# Patient Record
Sex: Female | Born: 1950 | Race: White | Hispanic: No | Marital: Single | Smoking: Former smoker
Health system: Southern US, Community
[De-identification: ages and names within clinical notes are randomized; demographics above are authoritative.]

## PROBLEM LIST (undated history)

## (undated) DIAGNOSIS — T7840XA Allergy, unspecified, initial encounter: Secondary | ICD-10-CM

## (undated) DIAGNOSIS — I4891 Unspecified atrial fibrillation: Secondary | ICD-10-CM

## (undated) DIAGNOSIS — E559 Vitamin D deficiency, unspecified: Secondary | ICD-10-CM

## (undated) DIAGNOSIS — R030 Elevated blood-pressure reading, without diagnosis of hypertension: Secondary | ICD-10-CM

## (undated) HISTORY — DX: Elevated blood-pressure reading, without diagnosis of hypertension: R03.0

## (undated) HISTORY — DX: Unspecified atrial fibrillation: I48.91

## (undated) HISTORY — DX: Allergy, unspecified, initial encounter: T78.40XA

---

## 1999-05-18 ENCOUNTER — Encounter: Admission: RE | Admit: 1999-05-18 | Discharge: 1999-05-18 | Payer: Self-pay | Admitting: Internal Medicine

## 1999-05-18 ENCOUNTER — Encounter: Payer: Self-pay | Admitting: Internal Medicine

## 1999-05-25 ENCOUNTER — Encounter: Admission: RE | Admit: 1999-05-25 | Discharge: 1999-05-25 | Payer: Self-pay | Admitting: Internal Medicine

## 1999-05-28 ENCOUNTER — Encounter: Admission: RE | Admit: 1999-05-28 | Discharge: 1999-05-28 | Payer: Self-pay | Admitting: Internal Medicine

## 1999-05-28 ENCOUNTER — Encounter: Payer: Self-pay | Admitting: Internal Medicine

## 2002-11-13 ENCOUNTER — Encounter: Payer: Self-pay | Admitting: Internal Medicine

## 2002-11-13 ENCOUNTER — Encounter: Admission: RE | Admit: 2002-11-13 | Discharge: 2002-11-13 | Payer: Self-pay | Admitting: Internal Medicine

## 2004-06-11 ENCOUNTER — Encounter: Admission: RE | Admit: 2004-06-11 | Discharge: 2004-06-11 | Payer: Self-pay | Admitting: Internal Medicine

## 2010-12-17 ENCOUNTER — Other Ambulatory Visit: Payer: Self-pay | Admitting: Internal Medicine

## 2010-12-17 DIAGNOSIS — Z1231 Encounter for screening mammogram for malignant neoplasm of breast: Secondary | ICD-10-CM

## 2011-02-25 ENCOUNTER — Ambulatory Visit
Admission: RE | Admit: 2011-02-25 | Discharge: 2011-02-25 | Disposition: A | Discharge status: Home / Self Care | Payer: BC Managed Care – PPO | Source: Ambulatory Visit | Attending: Internal Medicine | Admitting: Internal Medicine

## 2011-02-25 DIAGNOSIS — Z1231 Encounter for screening mammogram for malignant neoplasm of breast: Secondary | ICD-10-CM

## 2012-02-21 ENCOUNTER — Other Ambulatory Visit: Payer: Self-pay | Admitting: Internal Medicine

## 2012-02-21 DIAGNOSIS — Z1231 Encounter for screening mammogram for malignant neoplasm of breast: Secondary | ICD-10-CM

## 2012-04-13 ENCOUNTER — Ambulatory Visit
Admission: RE | Admit: 2012-04-13 | Discharge: 2012-04-13 | Disposition: A | Discharge status: Home / Self Care | Payer: BC Managed Care – PPO | Source: Ambulatory Visit | Attending: Internal Medicine | Admitting: Internal Medicine

## 2012-04-13 DIAGNOSIS — Z1231 Encounter for screening mammogram for malignant neoplasm of breast: Secondary | ICD-10-CM

## 2013-03-21 ENCOUNTER — Other Ambulatory Visit: Payer: Self-pay | Admitting: Internal Medicine

## 2013-03-21 DIAGNOSIS — Z1231 Encounter for screening mammogram for malignant neoplasm of breast: Secondary | ICD-10-CM

## 2013-05-24 ENCOUNTER — Other Ambulatory Visit: Payer: Self-pay | Admitting: Internal Medicine

## 2013-05-24 ENCOUNTER — Ambulatory Visit
Admission: RE | Admit: 2013-05-24 | Discharge: 2013-05-24 | Disposition: A | Discharge status: Home / Self Care | Payer: BC Managed Care – PPO | Source: Ambulatory Visit | Attending: Internal Medicine | Admitting: Internal Medicine

## 2013-05-24 DIAGNOSIS — R05 Cough: Secondary | ICD-10-CM

## 2013-05-24 DIAGNOSIS — Z1231 Encounter for screening mammogram for malignant neoplasm of breast: Secondary | ICD-10-CM

## 2014-03-26 IMAGING — CR DG CHEST 2V
2 series · 2 of 2 positions shown · non-contrast
Comparison: None.

CLINICAL DATA: Cough, shortness of breath.

EXAM:
CHEST  2 VIEW

[w chest pa]
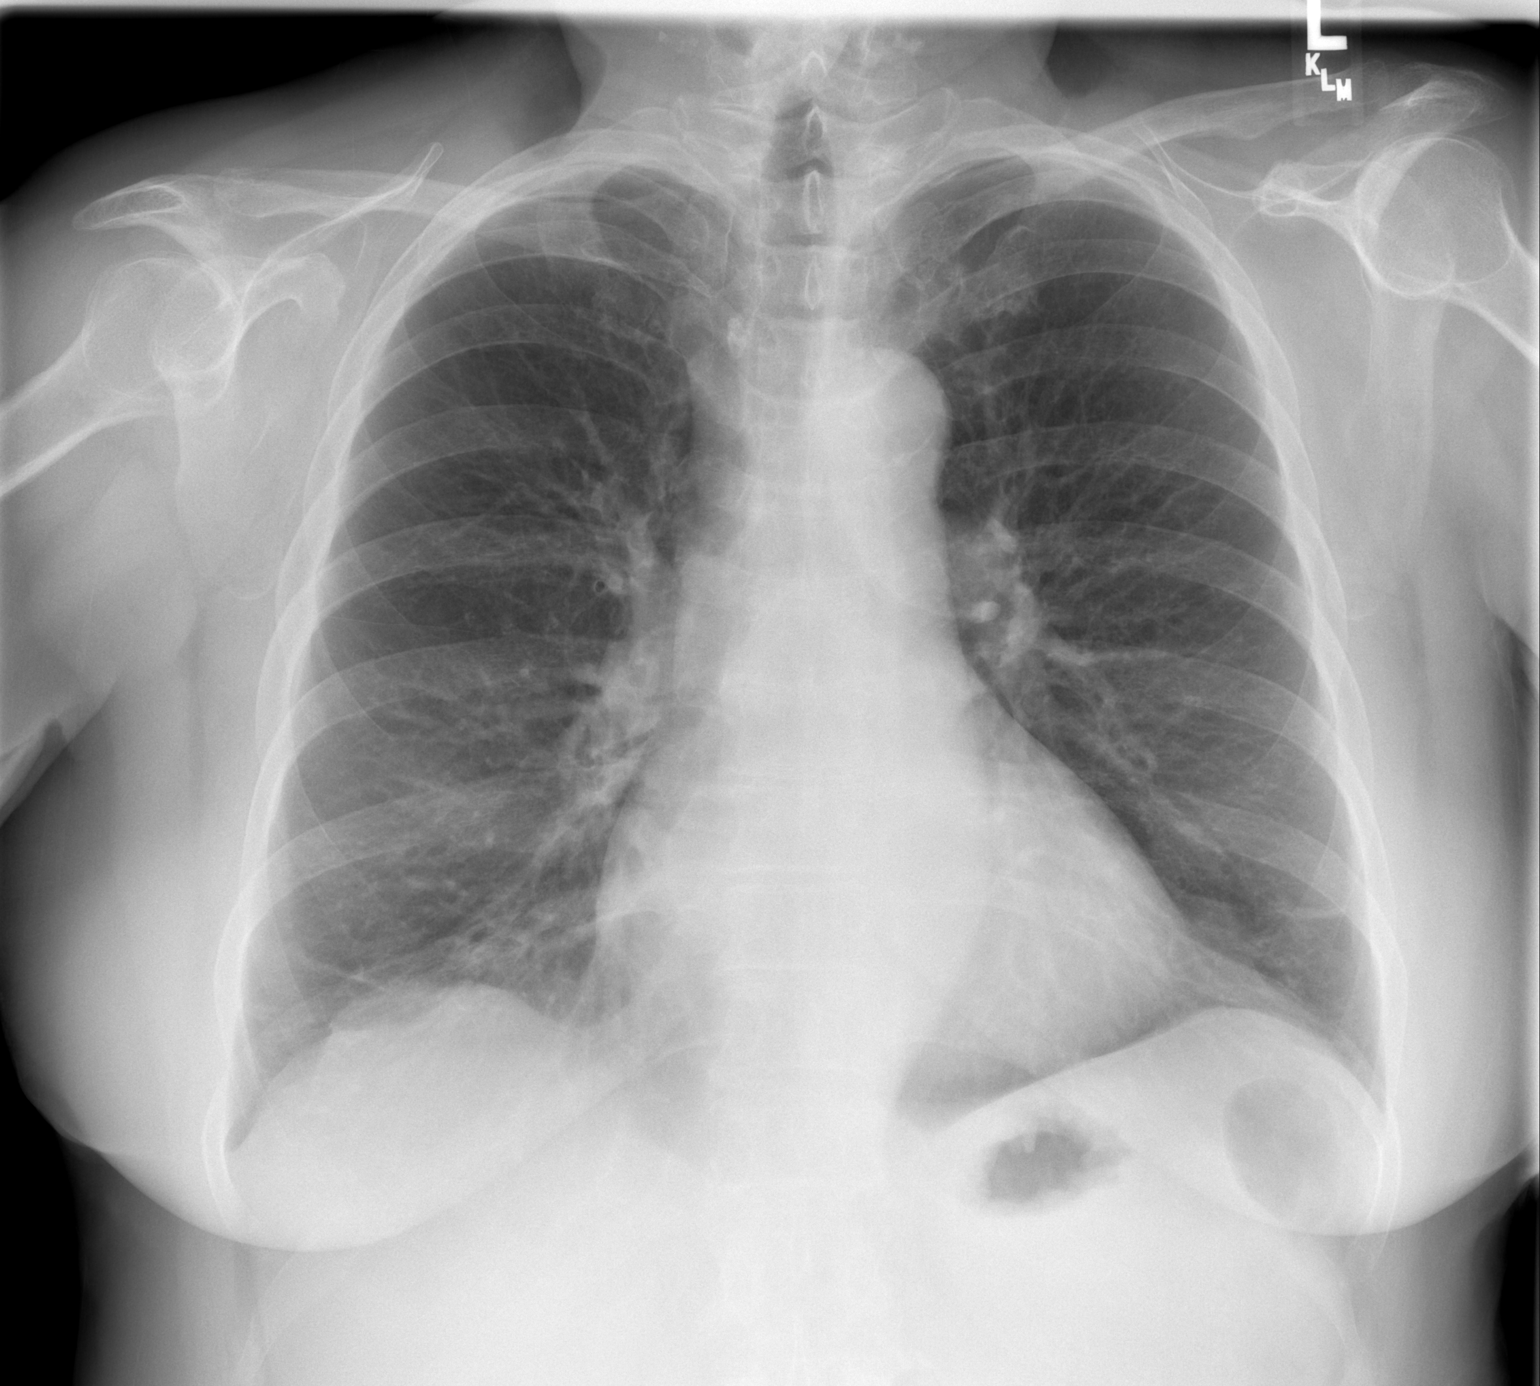

[w chest lat]
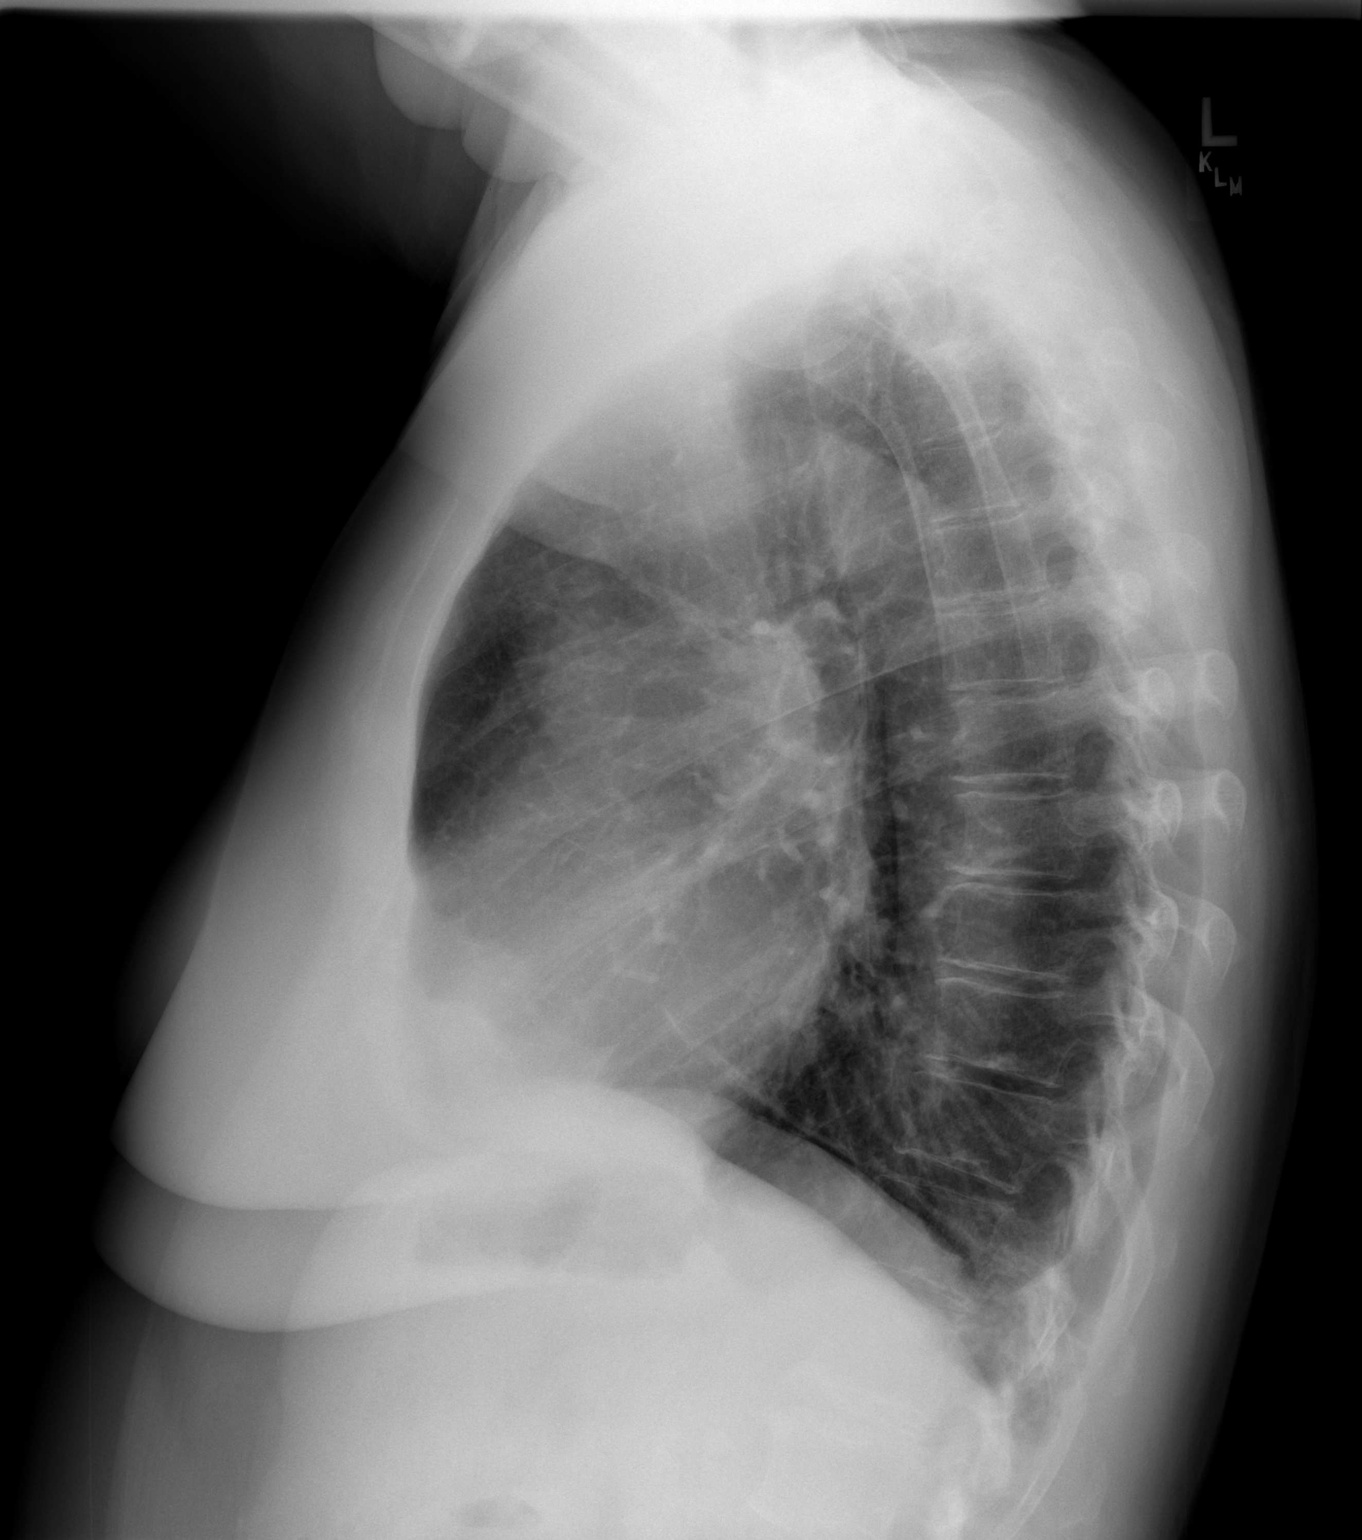

[2 of 2 positions shown; findings below may reference images not displayed]

FINDINGS: The heart size and mediastinal contours are within normal limits.
Small linear densities are noted in both lung bases suggesting
subsegmental atelectasis or possibly scarring. No pleural effusion
or pneumothorax is noted. The visualized skeletal structures are
unremarkable.
IMPRESSION: Minimal bilateral basilar subsegmental atelectasis or scarring. No
other abnormality seen.

## 2014-04-18 ENCOUNTER — Other Ambulatory Visit: Payer: Self-pay | Admitting: Internal Medicine

## 2014-04-18 DIAGNOSIS — Z1231 Encounter for screening mammogram for malignant neoplasm of breast: Secondary | ICD-10-CM

## 2014-06-13 ENCOUNTER — Ambulatory Visit
Admission: RE | Admit: 2014-06-13 | Discharge: 2014-06-13 | Disposition: A | Discharge status: Home / Self Care | Payer: BC Managed Care – PPO | Source: Ambulatory Visit | Attending: Internal Medicine | Admitting: Internal Medicine

## 2014-06-13 DIAGNOSIS — Z1231 Encounter for screening mammogram for malignant neoplasm of breast: Secondary | ICD-10-CM

## 2015-06-10 ENCOUNTER — Other Ambulatory Visit: Payer: Self-pay

## 2015-06-10 DIAGNOSIS — Z1231 Encounter for screening mammogram for malignant neoplasm of breast: Secondary | ICD-10-CM

## 2015-07-07 ENCOUNTER — Ambulatory Visit: Payer: Self-pay

## 2015-07-20 ENCOUNTER — Ambulatory Visit: Payer: Self-pay

## 2015-07-28 ENCOUNTER — Ambulatory Visit
Admission: RE | Admit: 2015-07-28 | Discharge: 2015-07-28 | Disposition: A | Discharge status: Home / Self Care | Payer: BLUE CROSS/BLUE SHIELD | Source: Ambulatory Visit

## 2015-07-28 DIAGNOSIS — Z1231 Encounter for screening mammogram for malignant neoplasm of breast: Secondary | ICD-10-CM

## 2019-09-13 ENCOUNTER — Ambulatory Visit: Payer: BLUE CROSS/BLUE SHIELD | Attending: Internal Medicine

## 2019-09-13 DIAGNOSIS — Z23 Encounter for immunization: Secondary | ICD-10-CM

## 2019-09-13 NOTE — Progress Notes (Signed)
   Covid-19 Vaccination Clinic  Name:  Jill Marquez    MRN: [id number] DOB: Nov 22, 1950  09/13/2019  Jill Marquez was observed post Covid-19 immunization for 15 minutes without incident. She was provided with Vaccine Information Sheet and instruction to access the V-Safe system.   Jill Marquez was instructed to call 911 with any severe reactions post vaccine: Marland Kitchen Difficulty breathing  . Swelling of face and throat  . A fast heartbeat  . A bad rash all over body  . Dizziness and weakness   Immunizations Administered    Name Date Dose VIS Date Route   Pfizer COVID-19 Vaccine 09/13/2019  2:18 PM 0.3 mL 06/14/2019 Intramuscular   Manufacturer: [employer], [employer]   Lot: [id number]   NDC: [address]-0

## 2019-10-07 ENCOUNTER — Ambulatory Visit: Payer: BLUE CROSS/BLUE SHIELD | Attending: Internal Medicine

## 2019-10-07 DIAGNOSIS — Z23 Encounter for immunization: Secondary | ICD-10-CM

## 2019-10-07 NOTE — Progress Notes (Signed)
   Covid-19 Vaccination Clinic  Name:  Jill Marquez    MRN: [id number] DOB: 11-Jun-1951  10/07/2019  [name] was observed post Covid-19 immunization for 15 minutes without incident. She was provided with Vaccine Information Sheet and instruction to access the V-Safe system.   [name] was instructed to call 911 with any severe reactions post vaccine: Marland Kitchen Difficulty breathing  . Swelling of face and throat  . A fast heartbeat  . A bad rash all over body  . Dizziness and weakness   Immunizations Administered    Name Date Dose VIS Date Route   Pfizer COVID-19 Vaccine 10/07/2019  3:40 PM 0.3 mL 06/14/2019 Intramuscular   Manufacturer: [employer], [employer]   Lot: [id number]   NDC: [address]-9

## 2020-04-18 ENCOUNTER — Ambulatory Visit: Payer: BLUE CROSS/BLUE SHIELD | Attending: Internal Medicine

## 2020-04-18 ENCOUNTER — Other Ambulatory Visit: Payer: Self-pay

## 2020-04-18 DIAGNOSIS — Z23 Encounter for immunization: Secondary | ICD-10-CM

## 2020-04-18 NOTE — Progress Notes (Signed)
   Covid-19 Vaccination Clinic  Name:  Jill Marquez    MRN: [id number] DOB: [birth year]  04/18/2020  [name] was observed post Covid-19 immunization for 15 minutes without incident. She was provided with Vaccine Information Sheet and instruction to access the V-Safe system.   [name] was instructed to call 911 with any severe reactions post vaccine: Marland Kitchen Difficulty breathing  . Swelling of face and throat  . A fast heartbeat  . A bad rash all over body  . Dizziness and weakness

## 2022-09-15 ENCOUNTER — Encounter (HOSPITAL_COMMUNITY): Payer: Self-pay

## 2022-09-15 ENCOUNTER — Ambulatory Visit (HOSPITAL_COMMUNITY)
Admission: EM | Admit: 2022-09-15 | Discharge: 2022-09-15 | Disposition: A | Discharge status: Home / Self Care | Payer: PPO | Attending: Family Medicine | Admitting: Family Medicine

## 2022-09-15 ENCOUNTER — Ambulatory Visit (INDEPENDENT_AMBULATORY_CARE_PROVIDER_SITE_OTHER): Payer: PPO

## 2022-09-15 DIAGNOSIS — R0602 Shortness of breath: Secondary | ICD-10-CM | POA: Diagnosis not present

## 2022-09-15 DIAGNOSIS — R062 Wheezing: Secondary | ICD-10-CM

## 2022-09-15 DIAGNOSIS — J189 Pneumonia, unspecified organism: Secondary | ICD-10-CM | POA: Diagnosis not present

## 2022-09-15 HISTORY — DX: Vitamin D deficiency, unspecified: E55.9

## 2022-09-15 MED ORDER — PREDNISONE 20 MG PO TABS: 40.0000 mg | ORAL_TABLET | Freq: Every day | ORAL | 0 refills | Status: DC

## 2022-09-15 MED ORDER — METHYLPREDNISOLONE SODIUM SUCC 125 MG IJ SOLR
125.0000 mg | Freq: Once | INTRAMUSCULAR | Status: AC
  Administered 2022-09-15: 125 mg via INTRAVENOUS

## 2022-09-15 MED ORDER — METHYLPREDNISOLONE SODIUM SUCC 125 MG IJ SOLR
INTRAMUSCULAR | Status: AC
  Filled 2022-09-15: qty 2

## 2022-09-15 MED ORDER — DOXYCYCLINE HYCLATE 100 MG PO CAPS: 100.0000 mg | ORAL_CAPSULE | Freq: Two times a day (BID) | ORAL | 0 refills | Status: DC

## 2022-09-15 MED ORDER — IPRATROPIUM-ALBUTEROL 0.5-2.5 (3) MG/3ML IN SOLN
RESPIRATORY_TRACT | Status: AC
  Filled 2022-09-15: qty 3

## 2022-09-15 MED ORDER — IPRATROPIUM-ALBUTEROL 0.5-2.5 (3) MG/3ML IN SOLN
3.0000 mL | Freq: Once | RESPIRATORY_TRACT | Status: AC
  Administered 2022-09-15: 3 mL via RESPIRATORY_TRACT

## 2022-09-15 MED ORDER — ALBUTEROL SULFATE HFA 108 (90 BASE) MCG/ACT IN AERS: 1.0000 | INHALATION_SPRAY | Freq: Four times a day (QID) | RESPIRATORY_TRACT | 1 refills | Status: DC | PRN

## 2022-09-15 NOTE — ED Provider Notes (Signed)
[facility]   [id number] 09/15/22 Arrival Time: [id number]  ASSESSMENT & PLAN:  1. Shortness of breath   2. Community acquired pneumonia of right upper lobe of lung   3. Wheezing    I have personally viewed the imaging studies ordered this visit. CXR: Question early infiltrate or RUL. No pneumothorax/effusions/masses appreciated.  Meds ordered this encounter  Medications   methylPREDNISolone sodium succinate (SOLU-MEDROL) 125 mg/2 mL injection 125 mg   ipratropium-albuterol (DUONEB) 0.5-2.5 (3) MG/3ML nebulizer solution 3 mL   ipratropium-albuterol (DUONEB) 0.5-2.5 (3) MG/3ML nebulizer solution 3 mL   doxycycline (VIBRAMYCIN) 100 MG capsule    Sig: Take 1 capsule (100 mg total) by mouth 2 (two) times daily.    Dispense:  20 capsule    Refill:  0   predniSONE (DELTASONE) 20 MG tablet    Sig: Take 2 tablets (40 mg total) by mouth daily.    Dispense:  10 tablet    Refill:  0   albuterol (VENTOLIN HFA) 108 (90 Base) MCG/ACT inhaler    Sig: Inhale 1-2 puffs into the lungs every 6 (six) hours as needed for wheezing or shortness of breath.    Dispense:  1 each    Refill:  1   SpO2 at 91-92% on RA at rest. No distress. She declines ED evaluation at this time though I recommend given borderline sats and general presentation.  May return here in a few days for re-check should she desire.  Agrees to:  Follow-up Information     [facility].   Specialty: Emergency Medicine Why: If symptoms worsen in any way. Contact information: [address] [id number] [place] [phone]                Reviewed expectations re: course of current medical issues. Questions answered. Outlined signs and symptoms indicating need for more acute intervention. Patient verbalized understanding. After Visit Summary given.   SUBJECTIVE: History from: patient and daughter . Athelene Bodenstein is a 72 y.o.  female who presents with complaint of feeling SOB; few days; wheezing at times; mild cough for the past couple of weeks along with chest congestion. Denies fever and CP Daughter reports no mental status changes. Ambulatory without difficulty. Denies abd/back pain/LE edema/orthopnea/PND. Normal bowel/bladder habits. Normal PO intake without n/v/d.  Social History   Tobacco Use  Smoking Status Former   Types: Cigarettes  Smokeless Tobacco Never   OBJECTIVE:  Vitals:   09/15/22 1503 09/15/22 1508 09/15/22 1656 09/15/22 1714  BP: (!) 146/77     Pulse: 82     Resp: (!) 24     Temp: 97.8 F (36.6 C)     TempSrc: Oral     SpO2: 90% 90% 91% 90%    Recheck RR after steroids and neb treatments: 18  General appearance: alert, oriented, no acute distress Eyes: PERRLA; EOMI; conjunctivae normal HENT: normocephalic; atraumatic Neck: supple with FROM Lungs: without labored respirations; speaks full sentences without difficulty;  bilat expiratory wheezing present Heart: regular Chest Wall: without tenderness to palpation Abdomen: soft, non-tender; no guarding or rebound tenderness Extremities: without edema; without calf swelling or tenderness; symmetrical without gross deformities Skin: warm and dry; without rash or lesions Neuro: normal gait Psychological: alert and cooperative; normal mood and affect   Imaging: DG Chest 2 View  Result Date: 09/15/2022 CLINICAL DATA:  Shortness of breath EXAM: CHEST - 2 VIEW COMPARISON:  Chest x-ray dated November twenty-first 2014 FINDINGS:  Cardiomegaly. New mild opacity of the right upper lobe. No pleural effusion or pneumothorax. IMPRESSION: 1. New mild opacity of the right upper lobe, concerning for infection. Recommend follow-up PA and lateral chest x-ray in 6-8 weeks to ensure resolution. 2. Cardiomegaly, new when compared with 2014 prior. Could be further evaluated with echocardiography. Electronically Signed   By: Yetta Glassman M.D.   On:  09/15/2022 15:48     No Known Allergies  Past Medical History:  Diagnosis Date   Vitamin D deficiency    Social History   Socioeconomic History   Marital status: Single    Spouse name: Not on file   Number of children: Not on file   Years of education: Not on file   Highest education level: Not on file  Occupational History   Not on file  Tobacco Use   Smoking status: Former    Types: Cigarettes   Smokeless tobacco: Never  Vaping Use   Vaping Use: Never used  Substance and Sexual Activity   Alcohol use: Not Currently   Drug use: Not Currently   Sexual activity: Not on file  Other Topics Concern   Not on file  Social History Narrative   Not on file   Social Determinants of Health   Financial Resource Strain: Not on file  Food Insecurity: Not on file  Transportation Needs: Not on file  Physical Activity: Not on file  Stress: Not on file  Social Connections: Not on file  Intimate Partner Violence: Not on file   History reviewed. No pertinent family history. History reviewed. No pertinent surgical history.    Vanessa Kick, MD 09/15/22 [phone]

## 2022-09-15 NOTE — ED Triage Notes (Signed)
Patient states she has been having SOB/wheezing x 2 weeks and worse in the past 6 days. O2 sats 90% on room air and patient placed on O2 2L/min via [place] sats increased to 94 and 95%.  Patient states her breathing improves with Primatene Mist.

## 2022-09-26 ENCOUNTER — Emergency Department (HOSPITAL_COMMUNITY): Payer: PPO

## 2022-09-26 ENCOUNTER — Telehealth (HOSPITAL_COMMUNITY): Payer: Self-pay | Admitting: Emergency Medicine

## 2022-09-26 ENCOUNTER — Ambulatory Visit (HOSPITAL_COMMUNITY)
Admission: RE | Admit: 2022-09-26 | Discharge: 2022-09-26 | Disposition: A | Discharge status: Home / Self Care | Payer: PPO | Source: Ambulatory Visit | Attending: Family Medicine | Admitting: Family Medicine

## 2022-09-26 ENCOUNTER — Other Ambulatory Visit: Payer: Self-pay

## 2022-09-26 ENCOUNTER — Encounter (HOSPITAL_COMMUNITY): Payer: Self-pay

## 2022-09-26 ENCOUNTER — Ambulatory Visit (HOSPITAL_COMMUNITY): Payer: PPO

## 2022-09-26 ENCOUNTER — Emergency Department (HOSPITAL_COMMUNITY)
Admission: EM | Admit: 2022-09-26 | Discharge: 2022-09-26 | Discharge status: Against Medical Advice | Payer: PPO | Attending: Emergency Medicine | Admitting: Emergency Medicine

## 2022-09-26 VITALS — BP 165/101 | HR 102 | Temp 98.8°F | Resp 24

## 2022-09-26 DIAGNOSIS — Z7901 Long term (current) use of anticoagulants: Secondary | ICD-10-CM | POA: Insufficient documentation

## 2022-09-26 DIAGNOSIS — R000 Tachycardia, unspecified: Secondary | ICD-10-CM | POA: Diagnosis not present

## 2022-09-26 DIAGNOSIS — R0902 Hypoxemia: Secondary | ICD-10-CM

## 2022-09-26 DIAGNOSIS — R600 Localized edema: Secondary | ICD-10-CM | POA: Insufficient documentation

## 2022-09-26 DIAGNOSIS — R009 Unspecified abnormalities of heart beat: Secondary | ICD-10-CM | POA: Insufficient documentation

## 2022-09-26 DIAGNOSIS — R0602 Shortness of breath: Secondary | ICD-10-CM | POA: Insufficient documentation

## 2022-09-26 DIAGNOSIS — Z5329 Procedure and treatment not carried out because of patient's decision for other reasons: Secondary | ICD-10-CM | POA: Diagnosis not present

## 2022-09-26 DIAGNOSIS — Z20822 Contact with and (suspected) exposure to covid-19: Secondary | ICD-10-CM | POA: Insufficient documentation

## 2022-09-26 LAB — CBC WITH DIFFERENTIAL/PLATELET
Abs Immature Granulocytes: 0.05 10*3/uL (ref 0.00–0.07)
Basophils Absolute: 0 10*3/uL (ref 0.0–0.1)
Basophils Relative: 0 %
Eosinophils Absolute: 0.1 10*3/uL (ref 0.0–0.5)
Eosinophils Relative: 1 %
HCT: 50.9 % — ABNORMAL HIGH (ref 36.0–46.0)
Hemoglobin: 16.7 g/dL — ABNORMAL HIGH (ref 12.0–15.0)
Immature Granulocytes: 1 %
Lymphocytes Relative: 15 %
Lymphs Abs: 1.3 10*3/uL (ref 0.7–4.0)
MCH: 32 pg (ref 26.0–34.0)
MCHC: 32.8 g/dL (ref 30.0–36.0)
MCV: 97.5 fL (ref 80.0–100.0)
Monocytes Absolute: 0.9 10*3/uL (ref 0.1–1.0)
Monocytes Relative: 11 %
Neutro Abs: 6 10*3/uL (ref 1.7–7.7)
Neutrophils Relative %: 72 %
Platelets: 182 10*3/uL (ref 150–400)
RBC: 5.22 MIL/uL — ABNORMAL HIGH (ref 3.87–5.11)
RDW: 13.4 % (ref 11.5–15.5)
WBC: 8.3 10*3/uL (ref 4.0–10.5)
nRBC: 0 % (ref 0.0–0.2)

## 2022-09-26 LAB — RESP PANEL BY RT-PCR (RSV, FLU A&B, COVID)  RVPGX2
Influenza A by PCR: NEGATIVE
Influenza B by PCR: NEGATIVE
Resp Syncytial Virus by PCR: NEGATIVE
SARS Coronavirus 2 by RT PCR: NEGATIVE

## 2022-09-26 LAB — BRAIN NATRIURETIC PEPTIDE: B Natriuretic Peptide: 697.6 pg/mL — ABNORMAL HIGH (ref 0.0–100.0)

## 2022-09-26 LAB — BASIC METABOLIC PANEL
Anion gap: 10 (ref 5–15)
BUN: 10 mg/dL (ref 8–23)
CO2: 23 mmol/L (ref 22–32)
Calcium: 8.7 mg/dL — ABNORMAL LOW (ref 8.9–10.3)
Chloride: 102 mmol/L (ref 98–111)
Creatinine, Ser: 0.91 mg/dL (ref 0.44–1.00)
GFR, Estimated: >60 mL/min (ref >60)
Glucose, Bld: 104 mg/dL — ABNORMAL HIGH (ref 70–99)
Potassium: 4 mmol/L (ref 3.5–5.1)
Sodium: 135 mmol/L (ref 135–145)

## 2022-09-26 MED ORDER — ALBUTEROL SULFATE HFA 108 (90 BASE) MCG/ACT IN AERS: 1.0000 | INHALATION_SPRAY | Freq: Four times a day (QID) | RESPIRATORY_TRACT | 0 refills | Status: DC | PRN

## 2022-09-26 MED ORDER — IOHEXOL 350 MG/ML SOLN
75.0000 mL | Freq: Once | INTRAVENOUS | Status: AC | PRN
  Administered 2022-09-26: 75 mL via INTRAVENOUS

## 2022-09-26 MED ORDER — IPRATROPIUM-ALBUTEROL 0.5-2.5 (3) MG/3ML IN SOLN: 3.0000 mL | RESPIRATORY_TRACT | 0 refills | Status: DC | PRN

## 2022-09-26 MED ORDER — APIXABAN 5 MG PO TABS: 5.0000 mg | ORAL_TABLET | Freq: Two times a day (BID) | ORAL | 0 refills | Status: DC

## 2022-09-26 MED ORDER — IPRATROPIUM-ALBUTEROL 0.5-2.5 (3) MG/3ML IN SOLN
3.0000 mL | Freq: Once | RESPIRATORY_TRACT | Status: AC
  Administered 2022-09-26: 3 mL via RESPIRATORY_TRACT
  Filled 2022-09-26: qty 3

## 2022-09-26 NOTE — ED Triage Notes (Signed)
Pt BIBGEMS from UC for worsening SOB, hypoxic in the 80s. Pt seen at UC last week dx with PNA, got abx and prednisone  94% 2LPM  115 HR 20 rr 149/103

## 2022-09-26 NOTE — ED Provider Notes (Signed)
[facility]   [id number] 09/26/22 Arrival Time: [id number]  ASSESSMENT & PLAN:  1. SOB (shortness of breath)   2. Hypoxia   3. Tachycardia    By exam, feels like she is in A.Fib. Ques flutter visible in V1. Unclear. Max HR here in the low 130s; hovering between high 80s to low 100s; variable.  With hypoxia and SOB, pt transferred to ED via [place]. Stable upon discharge. Sp02 92% on 2L O2 via .  ECG: Performed today and interpreted by me: see above; no STEMI.  CXR cancelled. Will let ED perform.  Reviewed expectations re: course of current medical issues. Questions answered. Outlined signs and symptoms indicating need for more acute intervention. Patient verbalized understanding. After Visit Summary given.   SUBJECTIVE:  History from: patient.  Jill Marquez is a 72 y.o. female whom I saw last week; refer to my previous note. At that time I treated her presumptively for a CAP with wheezing. Felt she was improving on antibiotic and steroids. I had recommended hospital evaluation at that time but she declined. Has been checking SpO2 at home and reports readings around 94% at rest.  Returns today with worsening SOB and fatigue. SOB mostly noted with exertion but does feel at rest Sp02 this morning at home was around 89-90%. Is ambulatory. Denies CP. Denies fever. Tolerating PO fluids without issue.  Social History   Tobacco Use  Smoking Status Former   Types: Cigarettes  Smokeless Tobacco Never   Denies: irregular heart beat, lower extremity edema, near-syncope, orthopnea, palpitations, paroxysmal nocturnal dyspnea, and syncope. Denies recreational drug use.  OBJECTIVE:  Vitals:   09/26/22 1456 09/26/22 1527  BP:  (!) 165/101  Pulse: 83 (!) 102  Resp: (!) 22 (!) 24  Temp: 98.8 F (37.1 C)   TempSrc: Oral   SpO2: 91% (!) 89%    General appearance: alert, oriented, appears fatigued Eyes: PERRLA; EOMI; conjunctivae normal HENT: normocephalic;  atraumatic Neck: supple with FROM Lungs: with mildly labored respirations; speaks short sentences then has to catch her breath; no appreciable wheezing today Heart: sounds irregular at times; pulse is irregularly irregular Extremities: without edema; without calf swelling or tenderness; symmetrical without gross deformities Skin: warm and dry; without rash or lesions Neuro: normal gait Psychological: alert and cooperative; normal mood and affect  No Known Allergies  Past Medical History:  Diagnosis Date   Vitamin D deficiency    Social History   Socioeconomic History   Marital status: Single    Spouse name: Not on file   Number of children: Not on file   Years of education: Not on file   Highest education level: Not on file  Occupational History   Not on file  Tobacco Use   Smoking status: Former    Types: Cigarettes   Smokeless tobacco: Never  Vaping Use   Vaping Use: Never used  Substance and Sexual Activity   Alcohol use: Not Currently   Drug use: Not Currently   Sexual activity: Not on file  Other Topics Concern   Not on file  Social History Narrative   Not on file   Social Determinants of Health   Financial Resource Strain: Not on file  Food Insecurity: Not on file  Transportation Needs: Not on file  Physical Activity: Not on file  Stress: Not on file  Social Connections: Not on file  Intimate Partner Violence: Not on file   History reviewed. No pertinent family history. History reviewed. No pertinent surgical history.  Vanessa Kick, MD 09/26/22 [phone]

## 2022-09-26 NOTE — Discharge Instructions (Addendum)
Your labs did show that you had an elevated BNP, which as we discussed could be due to early onset heart failure among other causes.  Your chest x-ray did show improved pneumonia, no need to continue antibiotics now.  No fluid on your lungs on today's chest x-ray. Your CT did show a nodule in the left upper lobe measuring 7 mm, you should follow-up with a repeat scan in 6 to 12 months through your PCP.  You were given a DuoNeb treatment in the emergency department after which you did feel slightly better.  You have been given a prescription for DuoNeb as well as albuterol inhaler to use as needed for shortness of breath.  You are going to be started on Eliquis which is a blood thinner as you have a new onset diagnosis of atrial fibrillation.  Being on a blood thinner can help reduce your risk of stroke  A consult has been placed to transition of care to assist with all of these needs regarding setting up a primary care physician, getting the nebulizer machine for home, and assistance with the atrial fibrillation clinic for which you have been given a referral.  You need to call the number included in your paperwork to schedule an appointment to follow-up with the atrial fibrillation clinic as soon as available.  Please return if you have severe chest pain, worsening of your shortness of breath, syncope, intractable vomiting or other concerns please return to the emergency department

## 2022-09-26 NOTE — ED Notes (Signed)
Ambulated pt around nurses station without supplemental 02. Sat dropped to 86%. 2L placed when returned back to room, now sating at 98%.

## 2022-09-26 NOTE — ED Notes (Signed)
[facility] made aware of transport  

## 2022-09-26 NOTE — ED Provider Notes (Signed)
  [facility] Provider Note   CSN: [id number] Arrival date & time: 09/26/22  1609     History {Add pertinent medical, surgical, social history, OB history to HPI:1} Chief Complaint  Patient presents with   Shortness of Breath    Jill Marquez is a 72 y.o. female.   Shortness of Breath      Home Medications Prior to Admission medications   Medication Sig Start Date End Date Taking? Authorizing Provider  albuterol (VENTOLIN HFA) 108 (90 Base) MCG/ACT inhaler Inhale 1-2 puffs into the lungs every 6 (six) hours as needed for wheezing or shortness of breath. 09/15/22   Vanessa Kick, MD      Allergies    Patient has no known allergies.    Review of Systems   Review of Systems  Respiratory:  Positive for shortness of breath.     Physical Exam Updated Vital Signs BP (!) 159/133   Pulse (!) 52   Temp 99.6 F (37.6 C) (Oral)   Resp (!) 22   Ht 5\' 3"  (1.6 m)   Wt 81.6 kg   SpO2 98%   BMI 31.89 kg/m  Physical Exam  ED Results / Procedures / Treatments   Labs (all labs ordered are listed, but only abnormal results are displayed) Labs Reviewed  RESP PANEL BY RT-PCR (RSV, FLU A&B, COVID)  [id number]  CBC WITH DIFFERENTIAL/PLATELET  BASIC METABOLIC PANEL  BRAIN NATRIURETIC PEPTIDE    EKG None  Radiology No results found.  Procedures Procedures  {Document cardiac monitor, telemetry assessment procedure when appropriate:1}  Medications Ordered in ED Medications - No data to display  ED Course/ Medical Decision Making/ A&P   {   Click here for ABCD2, HEART and other calculatorsREFRESH Note before signing :1}                          Medical Decision Making Amount and/or Complexity of Data Reviewed Labs: ordered. Radiology: ordered.   ***  {Document critical care time when appropriate:1} {Document review of labs and clinical decision tools ie heart score, Chads2Vasc2 etc:1}  {Document your independent  review of radiology images, and any outside records:1} {Document your discussion with family members, caretakers, and with consultants:1} {Document social determinants of health affecting pt's care:1} {Document your decision making why or why not admission, treatments were needed:1} Final Clinical Impression(s) / ED Diagnoses Final diagnoses:  None    Rx / DC Orders ED Discharge Orders     None

## 2022-09-26 NOTE — ED Triage Notes (Signed)
Pt states dx and tx'd for PNA last week. States her breathing is getting worse. States having SOB on exertion. States completed courses of antibiotics and prednisone. States used her inhaler today.

## 2022-09-26 NOTE — ED Notes (Signed)
Pt placed on supplemental oxygen via nasal canula per verbal order from Dr Mannie Stabile

## 2022-09-26 NOTE — ED Notes (Signed)
[facility] at bed side for transport to ED. 

## 2022-09-26 NOTE — Telephone Encounter (Signed)
Changed eliquis to a month's supply and resent to patient's pharmacy.  Bradd Canary, MD PGY2

## 2022-09-28 ENCOUNTER — Telehealth: Payer: Self-pay

## 2022-09-28 NOTE — Telephone Encounter (Signed)
        Patient  visited [facility] on 09/26/2022  for SOB.   Telephone encounter attempt :  1st  A HIPAA compliant voice message was left requesting a return call.  Instructed patient to call back at [phone].   [place] Resource Care Guide   [email or web address]  ?? [email or web address]   Website: [email or web address]  [email or web address]

## 2022-09-29 ENCOUNTER — Telehealth: Payer: Self-pay

## 2022-09-29 ENCOUNTER — Other Ambulatory Visit: Payer: Self-pay

## 2022-09-29 NOTE — Telephone Encounter (Signed)
     Patient  visit on 09/26/2022  at [facility] was for shortness of breath.  Have you been able to follow up with your primary care physician?   The patient was or was not able to obtain any needed medicine or equipment.  Spoke with patient, she has a Ventolin rescue inhaler and she will call Dr. Alcus Dad to inquire if she can write a prescription for more doses of ipratropium-albuterol (DUONEB) 0.5-2.5 (3) MG/3ML SOLN.   Are there diet recommendations that you are having difficulty following?  Patient expresses understanding of discharge instructions and education provided has no other needs at this time.    [place] Resource Care Guide   [email or web address]  ?? [id number]   Website: [email or web address]  [email or web address]

## 2022-09-29 NOTE — Telephone Encounter (Signed)
Patient calls nurse line requesting refills on nebulizer solution.  She was seen in the ED on 3/25 and received prescription for 2 vials.   She was instructed by [facility] care guide to contact our office to see if we would be able to provide her a refill on this medication until her appointment on 4/5.   Advised patient that as she is a new patient, Dr. Rock Nephew may not be able to prescribe medications for her until she establishes care at our office.   She asked that I send provider message.   Talbot Grumbling, RN

## 2022-09-29 NOTE — Telephone Encounter (Signed)
Refill not appropriate. Patient needs in-person evaluation before I will prescribe medication. Especially given concern for new-onset CHF and a-fib as the source of her dyspnea in the ED, however patient left AMA.   Alcus Dad, MD PGY-3, [place]

## 2022-09-29 NOTE — Telephone Encounter (Signed)
     Patient  visit on 09/26/2022  at [facility]. [facility] was for shortness of breath.  Have you been able to follow up with your primary care physician? Yes patient has appointment 10/07/2022.  The patient was or was not able to obtain any needed medicine or equipment. Patient was able to obtain medication. But needs more ipratropium-albuterol (DUONEB) 0.5-2.5 (3) MG/3ML SOLN. She was only given 2 vials.   Are there diet recommendations that you are having difficulty following? No  Patient expresses understanding of discharge instructions and education provided has no other needs at this time. Yes   [place] Resource Care Guide   [email or web address]  ?? [phone]   Website: [email or web address]  [email or web address]

## 2022-10-07 ENCOUNTER — Ambulatory Visit (HOSPITAL_COMMUNITY): Payer: PPO | Attending: Family Medicine

## 2022-10-07 ENCOUNTER — Encounter: Payer: Self-pay | Admitting: Family Medicine

## 2022-10-07 ENCOUNTER — Ambulatory Visit (INDEPENDENT_AMBULATORY_CARE_PROVIDER_SITE_OTHER): Payer: PPO | Admitting: Family Medicine

## 2022-10-07 VITALS — BP 123/85 | HR 70 | Ht 63.0 in | Wt 182.4 lb

## 2022-10-07 DIAGNOSIS — R911 Solitary pulmonary nodule: Secondary | ICD-10-CM

## 2022-10-07 DIAGNOSIS — I4891 Unspecified atrial fibrillation: Secondary | ICD-10-CM | POA: Insufficient documentation

## 2022-10-07 DIAGNOSIS — Z7689 Persons encountering health services in other specified circumstances: Secondary | ICD-10-CM

## 2022-10-07 DIAGNOSIS — R0602 Shortness of breath: Secondary | ICD-10-CM | POA: Diagnosis not present

## 2022-10-07 MED ORDER — METOPROLOL TARTRATE 25 MG PO TABS
25.0000 mg | ORAL_TABLET | Freq: Two times a day (BID) | ORAL | 0 refills | Status: DC
Start: 2022-10-07 — End: 2022-10-14

## 2022-10-07 MED ORDER — FUROSEMIDE 40 MG PO TABS
40.0000 mg | ORAL_TABLET | Freq: Every day | ORAL | 0 refills | Status: DC
Start: 2022-10-07 — End: 2022-10-17

## 2022-10-07 NOTE — Progress Notes (Unsigned)
   Subjective:    Patient ID: Jill Marquez, female    DOB: [birth year], 72 y.o.   MRN: [id number]  CC: Establish care  HPI:  Jill Marquez is a very pleasant 72 y.o. female who presents today to establish care. Prior PCP- wasn't seen for a few years, then PCP wasn't taking new patients  Initial concerns: Shortness of breath--  Recent ED visit on 09/26/22 for shortness of breath. Was found to have new onset a-fib, elevated BNP (697.6) and oxygen requirement. Left AMA.  Also, CT chest showed subpleural nodule of LUL (67mm): recommend non-con chest CT at 6-12 months.  Has appt with a-fib clinic on 10/17/22  Past medical history: recent a-fib diagnosis, remote h/o prediabetes and vit D deficiency-- both of which resolved  Current medications: Eliquis 5mg  BID, albuterol inhaler, duoneb (using it q4h)  Past surgical history: None  Family history: Daughter age 64 Dad died at age 58, no sig med problems Mom- HTN, heart disease, thyroid disease  Social history: Previously walking 3x/day, now unable to walk her dog Former smoker (5 cigarettes/day at the highest), smoked for ~35 years -- quit Apr 03, 2022 Alcohol: none currently, previously 1 glass wine/night Retired from healthcare (nursing, PT assistance, massage therapy)  Objective:  BP (!) 140/90   Pulse 70   Ht 5\' 3"  (1.6 m)   Wt 182 lb 6.4 oz (82.7 kg)   SpO2 94%   BMI 32.31 kg/m   Vitals and nursing note reviewed  General: NAD, pleasant, able to participate in exam HEENT: normal sclera and conjunctiva, TM normal bilaterally, oropharynx unremarkable Neck: thyroid smooth and normal in size, no cervical or supraclavicular lymphadenopathy Cardiac: RRR, S1 S2 present. normal heart sounds, no murmurs Respiratory: CTAB, normal effort, No wheezes, rales or rhonchi Abdomen: soft, non-tender, non-distended Extremities: no edema or cyanosis Skin: warm and dry, no rashes noted Neuro: alert, no obvious focal deficits Psych:  Normal affect and mood   Assessment & Plan:    No problem-specific Assessment & Plan notes found for this encounter.    Maury Dus, MD [facility] PGY-3

## 2022-10-07 NOTE — Patient Instructions (Addendum)
It was great to see you!  Things we discussed at today's visit: -We are checking an echocardiogram (ultrasound of your heart).  -I have sent furosemide (Lasix) to your pharmacy.  Take 1 tablet once daily. -Also sent metoprolol.  Take 1 tablet twice daily. -Continue your other medications. -Please go to your cardiology appointment on the 15th.  Follow-up with me next week. We will check blood work at that time. Please also schedule another follow-up visit ~2-3 weeks out.  Take care and seek immediate care sooner if you develop any concerns.  Dr. [facility]

## 2022-10-08 DIAGNOSIS — R911 Solitary pulmonary nodule: Secondary | ICD-10-CM | POA: Insufficient documentation

## 2022-10-08 NOTE — Assessment & Plan Note (Addendum)
Likely multifactorial.  Suspect main contributor is her current A-fib with RVR.  Will start metoprolol titrate 25 mg twice daily for rate control (see A-fib below).  I also have concern for CHF given recent BNP of 698, pedal edema, and orthopnea.  Will obtain echo and start Lasix 40 mg daily.  Close follow-up in 1 week.  Will check BMP as well as TSH, CBC at that time.

## 2022-10-08 NOTE — Assessment & Plan Note (Signed)
7 mm subpleural nodule on LUL noted on CT chest on 09/26/2022.  Will need repeat noncontrast chest CT in 6-12 months.

## 2022-10-08 NOTE — Assessment & Plan Note (Addendum)
EKG obtained today shows A-fib with RVR, with HR in the 120s.  Start metoprolol tartrate 25 mg BID for rate control. Will titrate dose as appropriate.  Continue Eliquis 5 mg BID.  Check echo. Has upcoming visit to establish with a-fib clinic on 4/15.

## 2022-10-14 ENCOUNTER — Encounter: Payer: Self-pay | Admitting: Family Medicine

## 2022-10-14 ENCOUNTER — Ambulatory Visit (HOSPITAL_COMMUNITY)
Admission: RE | Admit: 2022-10-14 | Discharge: 2022-10-14 | Disposition: A | Discharge status: Home / Self Care | Payer: PPO | Source: Ambulatory Visit | Attending: Family Medicine | Admitting: Family Medicine

## 2022-10-14 ENCOUNTER — Ambulatory Visit (INDEPENDENT_AMBULATORY_CARE_PROVIDER_SITE_OTHER): Payer: PPO | Admitting: Family Medicine

## 2022-10-14 VITALS — BP 155/107 | HR 94 | Ht 63.0 in | Wt 176.2 lb

## 2022-10-14 DIAGNOSIS — R030 Elevated blood-pressure reading, without diagnosis of hypertension: Secondary | ICD-10-CM | POA: Diagnosis not present

## 2022-10-14 DIAGNOSIS — I4891 Unspecified atrial fibrillation: Secondary | ICD-10-CM

## 2022-10-14 DIAGNOSIS — R0602 Shortness of breath: Secondary | ICD-10-CM

## 2022-10-14 MED ORDER — METOPROLOL TARTRATE 50 MG PO TABS: 50.0000 mg | ORAL_TABLET | Freq: Two times a day (BID) | ORAL | 0 refills | Status: DC

## 2022-10-14 NOTE — Progress Notes (Signed)
    SUBJECTIVE:   CHIEF COMPLAINT / HPI:   A-Fib Newly diagnosed a few weeks ago. Was in RVR with HR 120s at visit last week-- started on metoprolol titrate 25mg  BID at that time. Remains on eliquis.  Having palpitations--   Shortness of Breath Thought to be secondary to a-fib with RVR. Also concern for CHF due to BNP of 698, pedal edema and orthopnea. Was started on Lasix 40mg  daily. Has echo scheduled on 11/01/22. 3 pillows (almost sitting up) to 1 pillow orthopnea  Better since last visit. But can't do things quickly.  Not using duonebs every day anymore.   PERTINENT  PMH / PSH: per HPI. Otherwise none  OBJECTIVE:   BP (!) 138/120   Pulse 94   Ht 5\' 3"  (1.6 m)   Wt 176 lb 3.2 oz (79.9 kg)   SpO2 93%   BMI 31.21 kg/m   Gen: NAD, pleasant, able to participate in exam CV: RRR, normal S1/S2, no murmur Resp: Normal effort, lungs CTAB Extremities: no edema or cyanosis Skin: warm and dry, no rashes noted Neuro: alert, no obvious focal deficits Psych: Normal affect and mood   ASSESSMENT/PLAN:   No problem-specific Assessment & Plan notes found for this encounter.     Maury Dus, MD [facility]

## 2022-10-14 NOTE — Patient Instructions (Addendum)
It was great to see you!  Things we discussed at today's visit: - We are checking some labs. I will send you a MyChart message with the results or call if needed.  - We will increase your metoprolol to 50mg  twice daily.  - Continue your other medications - Keep the appointment with me on the 22nd  Take care and seek immediate care sooner if you develop any concerns.  Dr. [facility]

## 2022-10-15 DIAGNOSIS — R030 Elevated blood-pressure reading, without diagnosis of hypertension: Secondary | ICD-10-CM | POA: Insufficient documentation

## 2022-10-15 LAB — CBC
Hematocrit: 50.6 % — ABNORMAL HIGH (ref 34.0–46.6)
Hemoglobin: 17.1 g/dL — ABNORMAL HIGH (ref 11.1–15.9)
MCH: 31.2 pg (ref 26.6–33.0)
MCHC: 33.8 g/dL (ref 31.5–35.7)
MCV: 92 fL (ref 79–97)
Platelets: 335 10*3/uL (ref 150–450)
RBC: 5.48 x10E6/uL — ABNORMAL HIGH (ref 3.77–5.28)
RDW: 12.3 % (ref 11.7–15.4)
WBC: 6 10*3/uL (ref 3.4–10.8)

## 2022-10-15 LAB — BASIC METABOLIC PANEL
BUN/Creatinine Ratio: 15 (ref 12–28)
BUN: 15 mg/dL (ref 8–27)
CO2: 27 mmol/L (ref 20–29)
Calcium: 9.7 mg/dL (ref 8.7–10.3)
Chloride: 99 mmol/L (ref 96–106)
Creatinine, Ser: 1 mg/dL (ref 0.57–1.00)
Glucose: 103 mg/dL — ABNORMAL HIGH (ref 70–99)
Potassium: 4 mmol/L (ref 3.5–5.2)
Sodium: 141 mmol/L (ref 134–144)
eGFR: 61 mL/min/{1.73_m2} (ref >59)

## 2022-10-15 LAB — TSH RFX ON ABNORMAL TO FREE T4: TSH: 0.954 u[IU]/mL (ref 0.450–4.500)

## 2022-10-15 NOTE — Assessment & Plan Note (Addendum)
Improving. Still thought to be multifactorial secondary to a-fib and likely CHF.   -Increase metoprolol as above. -Continue Lasix 40 mg daily.  -F/u echo results.  -Check CBC, BMP, TSH today

## 2022-10-15 NOTE — Assessment & Plan Note (Addendum)
Remains in A-fib.  Heart rate in the 90s.  Still symptomatic. -Increase metoprolol tartrate to 50 mg BID -Continue Eliquis 5mg  BID -Check CBC, BMP, TSH today -Has upcoming echo -F/u with a-fib clinic on 4/15

## 2022-10-15 NOTE — Assessment & Plan Note (Signed)
BP elevated today x2. No prior diagnosis of HTN. -Increasing metoprolol as above -F/u in 2 weeks

## 2022-10-17 ENCOUNTER — Ambulatory Visit (HOSPITAL_COMMUNITY)
Admission: RE | Admit: 2022-10-17 | Discharge: 2022-10-17 | Disposition: A | Discharge status: Home / Self Care | Payer: PPO | Source: Ambulatory Visit | Attending: Internal Medicine | Admitting: Internal Medicine

## 2022-10-17 ENCOUNTER — Other Ambulatory Visit: Payer: Self-pay

## 2022-10-17 VITALS — BP 144/96 | HR 89 | Ht 63.0 in | Wt 177.0 lb

## 2022-10-17 DIAGNOSIS — I4819 Other persistent atrial fibrillation: Secondary | ICD-10-CM | POA: Diagnosis present

## 2022-10-17 DIAGNOSIS — Z7901 Long term (current) use of anticoagulants: Secondary | ICD-10-CM | POA: Diagnosis not present

## 2022-10-17 DIAGNOSIS — Z6831 Body mass index (BMI) 31.0-31.9, adult: Secondary | ICD-10-CM | POA: Diagnosis not present

## 2022-10-17 DIAGNOSIS — E669 Obesity, unspecified: Secondary | ICD-10-CM | POA: Diagnosis not present

## 2022-10-17 DIAGNOSIS — Z79899 Other long term (current) drug therapy: Secondary | ICD-10-CM | POA: Insufficient documentation

## 2022-10-17 MED ORDER — APIXABAN 5 MG PO TABS: 5.0000 mg | ORAL_TABLET | Freq: Two times a day (BID) | ORAL | 3 refills | Status: DC

## 2022-10-17 MED ORDER — FUROSEMIDE 40 MG PO TABS: 40.0000 mg | ORAL_TABLET | Freq: Every day | ORAL | 3 refills | Status: DC

## 2022-10-17 MED ORDER — METOPROLOL TARTRATE 50 MG PO TABS: 50.0000 mg | ORAL_TABLET | Freq: Two times a day (BID) | ORAL | 3 refills | Status: DC

## 2022-10-17 NOTE — H&P (View-Only) (Signed)
  Primary Care Physician: Wells, Ashleigh, MD Primary Cardiologist: None Primary Electrophysiologist: None Referring Physician: [facility]   Jill Marquez is a 71 y.o. female with no past medical history who presents for consultation in the [facility]. The patient was initially diagnosed with atrial fibrillation on 09/26/22 after presenting to [facility] for the second time. She presented to the urgent care on 3/14 for shortness of breath and was diagnosed with CAP. She presented to urgent care again on 3/25 with ongoing shortness of breath and required respiratory support in form of nasal cannula and found to be in Afib with RVR. She was transferred to ED on same day. She also had bilateral lower extremity edema and BNP was 698. CT PE was negative but found incidental pulmonary nodule. She left AMA and started on Eliquis. Established with PCP on 4/5 and started on Lopressor 25 mg BID and lasix 40 mg daily. Echocardiogram has been ordered. PCP f/u 4/12 Lopressor increased to 50 mg BID. Patient is on Eliquis 5 mg BID for a CHADS2VASC score of 2.  On evaluation today, she is currently in Afib. She does note that she feels better since PCP increased metoprolol dosage. No worsening shortness of breath, chest pain, or edema. She definitely notes the edema has improved. Scheduled for echo at end of this month.   Denies snoring, stop breathing. She sleeps well and wakes up feeling rested.  She is compliant with anticoagulation and has not missed any doses. She has no bleeding concerns.  Today, she denies symptoms of palpitations, chest pain, shortness of breath, orthopnea, PND, lower extremity edema, dizziness, presyncope, syncope, snoring, daytime somnolence, bleeding, or neurologic sequela. The patient is tolerating medications without difficulties and is otherwise without complaint today.   Atrial Fibrillation Risk Factors:  she does not have symptoms or  diagnosis of sleep apnea. The patient does not have a history of early familial atrial fibrillation or other arrhythmias.  she has a BMI of Body mass index is 31.35 kg/m.. Filed Weights   10/17/22 1444  Weight: 80.3 kg    Family History  Problem Relation Age of Onset   Hypertension Mother    Thyroid disease Mother    Heart disease Mother      Atrial Fibrillation Management history:  Previous antiarrhythmic drugs: None Previous cardioversions: None Previous ablations: None Anticoagulation history: Eliquis 5 mg BID    Past Medical History:  Diagnosis Date   Atrial fibrillation    Vitamin D deficiency    No past surgical history on file.  Current Outpatient Medications  Medication Sig Dispense Refill   albuterol (VENTOLIN HFA) 108 (90 Base) MCG/ACT inhaler Inhale 1-2 puffs into the lungs every 6 (six) hours as needed for wheezing or shortness of breath. 8 g 0   ipratropium-albuterol (DUONEB) 0.5-2.5 (3) MG/3ML SOLN Take 3 mLs by nebulization every 4 (four) hours as needed. 360 mL 0   apixaban (ELIQUIS) 5 MG TABS tablet Take 1 tablet (5 mg total) by mouth 2 (two) times daily. 60 tablet 3   furosemide (LASIX) 40 MG tablet Take 1 tablet (40 mg total) by mouth daily. 30 tablet 3   metoprolol tartrate (LOPRESSOR) 50 MG tablet Take 1 tablet (50 mg total) by mouth 2 (two) times daily. 60 tablet 3   No current facility-administered medications for this encounter.    No Known Allergies  Social History   Socioeconomic History   Marital status: Single    Spouse name:   Not on file   Number of children: Not on file   Years of education: Not on file   Highest education level: Some college, no degree  Occupational History   Not on file  Tobacco Use   Smoking status: Former    Types: Cigarettes   Smokeless tobacco: Never  Vaping Use   Vaping Use: Never used  Substance and Sexual Activity   Alcohol use: Not Currently   Drug use: Not Currently   Sexual activity: Not on file   Other Topics Concern   Not on file  Social History Narrative   Not on file   Social Determinants of Health   Financial Resource Strain: Low Risk  (10/13/2022)   Overall Financial Resource Strain (CARDIA)    Difficulty of Paying Living Expenses: Not very hard  Food Insecurity: No Food Insecurity (10/13/2022)   Hunger Vital Sign    Worried About Running Out of Food in the Last Year: Never true    Ran Out of Food in the Last Year: Never true  Transportation Needs: No Transportation Needs (10/13/2022)   PRAPARE - Transportation    Lack of Transportation (Medical): No    Lack of Transportation (Non-Medical): No  Physical Activity: Unknown (10/13/2022)   Exercise Vital Sign    Days of Exercise per Week: Patient declined    Minutes of Exercise per Session: Not on file  Stress: Patient Declined (10/13/2022)   [employer] - Occupational Stress Questionnaire    Feeling of Stress : Patient declined  Social Connections: Unknown (10/13/2022)   Social Connection and Isolation Panel [NHANES]    Frequency of Communication with Friends and Family: More than three times a week    Frequency of Social Gatherings with Friends and Family: More than three times a week    Attends Religious Services: More than 4 times per year    Active Member of Clubs or Organizations: Yes    Attends Club or Organization Meetings: More than 4 times per year    Marital Status: Patient declined  Intimate Partner Violence: Not on file     ROS- All systems are reviewed and negative except as per the HPI above.  Physical Exam: Vitals:   10/17/22 1444  BP: (!) 144/96  Pulse: 89  Weight: 80.3 kg  Height: 5' 3" (1.6 m)    GEN- The patient is well appearing, alert and oriented x 3 today.   Head- normocephalic, atraumatic Eyes-  Sclera clear, conjunctiva pink Ears- hearing intact Oropharynx- clear Neck- supple, no JVP Lymph- no cervical lymphadenopathy Lungs- Clear to ausculation  bilaterally, normal work of breathing Heart- Irregular rate and rhythm, no murmurs, rubs or gallops, PMI not laterally displaced GI- soft, NT, ND, + BS Extremities- no clubbing, cyanosis, or edema MS- no significant deformity or atrophy Skin- no rash or lesion Psych- euthymic mood, full affect Neuro- strength and sensation are intact   Wt Readings from Last 3 Encounters:  10/17/22 80.3 kg  10/14/22 79.9 kg  10/07/22 82.7 kg    EKG today demonstrates Afib with PVC LAD HR 89 PR * ms QRS 96 ms QT/Qtc 390/474 ms  Echo N/A: Scheduled for 4/30  Epic records are reviewed at length today.  CHA2DS2-VASc Score = 2  The patient's score is based upon: CHF History: 0 HTN History: 0 Diabetes History: 0 Stroke History: 0 Vascular Disease History: 0 Age Score: 1 Gender Score: 1       ASSESSMENT AND PLAN: Persistent Atrial Fibrillation (ICD10:    I48.19) The patient's CHA2DS2-VASc score is 2, indicating a 2.2% annual risk of stroke.    Education provided about Afib with visual diagram. Discussion about medication treatments and ablation and cardioversion in detail. After discussion, we will proceed with scheduling cardioversion. First day of full anticoagulation on 3/26. No missed doses.   With her risk score for stroke, we can discuss coming off anticoagulation 4 weeks post DCCV. Her BP has been elevated a couple of times, but she has no specific history I can find of HTN. Recommend trend BP after DCCV.  She does not appear fluid overloaded today and denies acute shortness of breath (only with exertion).   Continue metoprolol 50 mg BID.   Okay to have cup of coffee daily. Limit alcoholic beverages 1-2/week okay.   2. Obesity Body mass index is 31.35 kg/m. Lifestyle modification was discussed at length including regular exercise and weight reduction. Encouraged daily walking.   Follow up 1-2 weeks after DCCV.    Haakon Titsworth, PA-C [facility]  [facility] [address] [place], [place] [address] [phone] 10/17/2022 3:46 PM  

## 2022-10-17 NOTE — Patient Instructions (Addendum)
Cardioversion scheduled for: Thursday, April 25th   - Arrive at the [facility] and go to admitting at 11:15AM   - Do not eat or drink anything after midnight the night prior to your procedure.   - Take all your morning medication (except diabetic medications) with a sip of water prior to arrival.  - You will not be able to drive home after your procedure.    - Do NOT miss any doses of your blood thinner - if you should miss a dose please notify our office immediately.   - If you feel as if you go back into normal rhythm prior to scheduled cardioversion, please notify our office immediately.   If your procedure is canceled in the cardioversion suite you will be charged a cancellation fee.

## 2022-10-17 NOTE — Progress Notes (Signed)
Primary Care Physician: Maury Dus, MD Primary Cardiologist: None Primary Electrophysiologist: None Referring Physician: [facility]   Jill Marquez is a 72 y.o. female with no past medical history who presents for consultation in the [facility]. The patient was initially diagnosed with atrial fibrillation on 09/26/22 after presenting to [facility] for the second time. She presented to the urgent care on 3/14 for shortness of breath and was diagnosed with CAP. She presented to urgent care again on 3/25 with ongoing shortness of breath and required respiratory support in form of nasal cannula and found to be in Afib with RVR. She was transferred to ED on same day. She also had bilateral lower extremity edema and BNP was 698. CT PE was negative but found incidental pulmonary nodule. She left AMA and started on Eliquis. Established with PCP on 4/5 and started on Lopressor 25 mg BID and lasix 40 mg daily. Echocardiogram has been ordered. PCP f/u 4/12 Lopressor increased to 50 mg BID. Patient is on Eliquis 5 mg BID for a CHADS2VASC score of 2.  On evaluation today, she is currently in Afib. She does note that she feels better since PCP increased metoprolol dosage. No worsening shortness of breath, chest pain, or edema. She definitely notes the edema has improved. Scheduled for echo at end of this month.   Denies snoring, stop breathing. She sleeps well and wakes up feeling rested.  She is compliant with anticoagulation and has not missed any doses. She has no bleeding concerns.  Today, she denies symptoms of palpitations, chest pain, shortness of breath, orthopnea, PND, lower extremity edema, dizziness, presyncope, syncope, snoring, daytime somnolence, bleeding, or neurologic sequela. The patient is tolerating medications without difficulties and is otherwise without complaint today.   Atrial Fibrillation Risk Factors:  she does not have symptoms or  diagnosis of sleep apnea. The patient does not have a history of early familial atrial fibrillation or other arrhythmias.  she has a BMI of Body mass index is 31.35 kg/m.Marland Kitchen Filed Weights   10/17/22 1444  Weight: 80.3 kg    Family History  Problem Relation Age of Onset   Hypertension Mother    Thyroid disease Mother    Heart disease Mother      Atrial Fibrillation Management history:  Previous antiarrhythmic drugs: None Previous cardioversions: None Previous ablations: None Anticoagulation history: Eliquis 5 mg BID    Past Medical History:  Diagnosis Date   Atrial fibrillation    Vitamin D deficiency    No past surgical history on file.  Current Outpatient Medications  Medication Sig Dispense Refill   albuterol (VENTOLIN HFA) 108 (90 Base) MCG/ACT inhaler Inhale 1-2 puffs into the lungs every 6 (six) hours as needed for wheezing or shortness of breath. 8 g 0   ipratropium-albuterol (DUONEB) 0.5-2.5 (3) MG/3ML SOLN Take 3 mLs by nebulization every 4 (four) hours as needed. 360 mL 0   apixaban (ELIQUIS) 5 MG TABS tablet Take 1 tablet (5 mg total) by mouth 2 (two) times daily. 60 tablet 3   furosemide (LASIX) 40 MG tablet Take 1 tablet (40 mg total) by mouth daily. 30 tablet 3   metoprolol tartrate (LOPRESSOR) 50 MG tablet Take 1 tablet (50 mg total) by mouth 2 (two) times daily. 60 tablet 3   No current facility-administered medications for this encounter.    No Known Allergies  Social History   Socioeconomic History   Marital status: Single    Spouse name:  Not on file   Number of children: Not on file   Years of education: Not on file   Highest education level: Some college, no degree  Occupational History   Not on file  Tobacco Use   Smoking status: Former    Types: Cigarettes   Smokeless tobacco: Never  Vaping Use   Vaping Use: Never used  Substance and Sexual Activity   Alcohol use: Not Currently   Drug use: Not Currently   Sexual activity: Not on file   Other Topics Concern   Not on file  Social History Narrative   Not on file   Social Determinants of Health   Financial Resource Strain: Low Risk  (10/13/2022)   Overall Financial Resource Strain (CARDIA)    Difficulty of Paying Living Expenses: Not very hard  Food Insecurity: No Food Insecurity (10/13/2022)   Hunger Vital Sign    Worried About Running Out of Food in the Last Year: Never true    Ran Out of Food in the Last Year: Never true  Transportation Needs: No Transportation Needs (10/13/2022)   PRAPARE - Administrator, Civil Service (Medical): No    Lack of Transportation (Non-Medical): No  Physical Activity: Unknown (10/13/2022)   Exercise Vital Sign    Days of Exercise per Week: Patient declined    Minutes of Exercise per Session: Not on file  Stress: Patient Declined (10/13/2022)   [name] of Occupational Health - Occupational Stress Questionnaire    Feeling of Stress : Patient declined  Social Connections: Unknown (10/13/2022)   Social Connection and Isolation Panel [NHANES]    Frequency of Communication with Friends and Family: More than three times a week    Frequency of Social Gatherings with Friends and Family: More than three times a week    Attends Religious Services: More than 4 times per year    Active Member of [employer] or Organizations: Yes    Attends Engineer, structural: More than 4 times per year    Marital Status: Patient declined  Intimate Partner Violence: Not on file     ROS- All systems are reviewed and negative except as per the HPI above.  Physical Exam: Vitals:   10/17/22 1444  BP: (!) 144/96  Pulse: 89  Weight: 80.3 kg  Height:  (1.6 m)    GEN- The patient is well appearing, alert and oriented x 3 today.   Head- normocephalic, atraumatic Eyes-  Sclera clear, conjunctiva pink Ears- hearing intact Oropharynx- clear Neck- supple, no JVP Lymph- no cervical lymphadenopathy Lungs- Clear to ausculation  bilaterally, normal work of breathing Heart- Irregular rate and rhythm, no murmurs, rubs or gallops, PMI not laterally displaced GI- soft, NT, ND, + BS Extremities- no clubbing, cyanosis, or edema MS- no significant deformity or atrophy Skin- no rash or lesion Psych- euthymic mood, full affect Neuro- strength and sensation are intact   Wt Readings from Last 3 Encounters:  10/17/22 80.3 kg  10/14/22 79.9 kg  10/07/22 82.7 kg    EKG today demonstrates Afib with PVC LAD HR 89 PR * ms QRS 96 ms QT/Qtc 390/474 ms  Echo N/A: Scheduled for 4/30  Epic records are reviewed at length today.  CHA2DS2-VASc Score = 2  The patient's score is based upon: CHF History: 0 HTN History: 0 Diabetes History: 0 Stroke History: 0 Vascular Disease History: 0 Age Score: 1 Gender Score: 1       ASSESSMENT AND PLAN: Persistent Atrial Fibrillation (ICD10:  I48.19) The patient's CHA2DS2-VASc score is 2, indicating a 2.2% annual risk of stroke.    Education provided about Afib with visual diagram. Discussion about medication treatments and ablation and cardioversion in detail. After discussion, we will proceed with scheduling cardioversion. First day of full anticoagulation on 3/26. No missed doses.   With her risk score for stroke, we can discuss coming off anticoagulation 4 weeks post DCCV. Her BP has been elevated a couple of times, but she has no specific history I can find of HTN. Recommend trend BP after DCCV.  She does not appear fluid overloaded today and denies acute shortness of breath (only with exertion).   Continue metoprolol 50 mg BID.   Okay to have cup of coffee daily. Limit alcoholic beverages 1-2/week okay.   2. Obesity Body mass index is 31.35 kg/m. Lifestyle modification was discussed at length including regular exercise and weight reduction. Encouraged daily walking.   Follow up 1-2 weeks after DCCV.    Lake Bells, PA-C [facility] [address] [place], [place] [address] [phone] 10/17/2022 3:46 PM

## 2022-10-24 ENCOUNTER — Ambulatory Visit: Payer: Self-pay | Admitting: Family Medicine

## 2022-10-26 NOTE — Pre-Procedure Instructions (Signed)
Spoke to patient on phone regarding instructions for cardioversion - come at 1115, NPO after 0000, ensured ride home and responsible person to stay with pt for 24 hours after, confirmed no missed doses of eliquis, answered questions for pt

## 2022-10-27 ENCOUNTER — Ambulatory Visit (HOSPITAL_COMMUNITY)
Admission: RE | Admit: 2022-10-27 | Discharge: 2022-10-27 | Disposition: A | Discharge status: Home / Self Care | Payer: PPO | Attending: Internal Medicine | Admitting: Internal Medicine

## 2022-10-27 ENCOUNTER — Ambulatory Visit (HOSPITAL_BASED_OUTPATIENT_CLINIC_OR_DEPARTMENT_OTHER): Payer: PPO | Admitting: Certified Registered"

## 2022-10-27 ENCOUNTER — Ambulatory Visit (HOSPITAL_COMMUNITY): Payer: PPO | Admitting: Certified Registered"

## 2022-10-27 ENCOUNTER — Encounter (HOSPITAL_COMMUNITY): Payer: Self-pay | Admitting: Internal Medicine

## 2022-10-27 ENCOUNTER — Encounter (HOSPITAL_COMMUNITY)
Admission: RE | Disposition: A | Discharge status: Home / Self Care | Payer: Self-pay | Source: Home / Self Care | Attending: Internal Medicine

## 2022-10-27 ENCOUNTER — Other Ambulatory Visit: Payer: Self-pay

## 2022-10-27 DIAGNOSIS — Z79899 Other long term (current) drug therapy: Secondary | ICD-10-CM | POA: Insufficient documentation

## 2022-10-27 DIAGNOSIS — I4891 Unspecified atrial fibrillation: Secondary | ICD-10-CM | POA: Diagnosis not present

## 2022-10-27 DIAGNOSIS — I4819 Other persistent atrial fibrillation: Secondary | ICD-10-CM | POA: Insufficient documentation

## 2022-10-27 DIAGNOSIS — Z87891 Personal history of nicotine dependence: Secondary | ICD-10-CM

## 2022-10-27 DIAGNOSIS — E669 Obesity, unspecified: Secondary | ICD-10-CM | POA: Insufficient documentation

## 2022-10-27 DIAGNOSIS — Z6831 Body mass index (BMI) 31.0-31.9, adult: Secondary | ICD-10-CM | POA: Insufficient documentation

## 2022-10-27 DIAGNOSIS — Z7901 Long term (current) use of anticoagulants: Secondary | ICD-10-CM | POA: Insufficient documentation

## 2022-10-27 HISTORY — PX: CARDIOVERSION: SHX1299

## 2022-10-27 SURGERY — CARDIOVERSION
Anesthesia: General

## 2022-10-27 MED ORDER — SODIUM CHLORIDE 0.9 % IV SOLN: INTRAVENOUS | Status: DC

## 2022-10-27 MED ORDER — LIDOCAINE HCL (CARDIAC) PF 100 MG/5ML IV SOSY
PREFILLED_SYRINGE | INTRAVENOUS | Status: DC | PRN
  Administered 2022-10-27: 60 mg via INTRAVENOUS

## 2022-10-27 MED ORDER — PROPOFOL 10 MG/ML IV BOLUS
INTRAVENOUS | Status: DC | PRN
  Administered 2022-10-27: 70 mg via INTRAVENOUS

## 2022-10-27 SURGICAL SUPPLY — 1 items: ELECT DEFIB PAD ADLT CADENCE (PAD) ×2 IMPLANT

## 2022-10-27 NOTE — Interval H&P Note (Signed)
History and Physical Interval Note:  10/27/2022 11:28 AM  Jill Marquez  has presented today for surgery, with the diagnosis of AFIB.  The various methods of treatment have been discussed with the patient and family. After consideration of risks, benefits and other options for treatment, the patient has consented to  Procedure(s): CARDIOVERSION (N/A) as a surgical intervention.  The patient's history has been reviewed, patient examined, no change in status, stable for surgery.  I have reviewed the patient's chart and labs.  Questions were answered to the patient's satisfaction.     [name]

## 2022-10-27 NOTE — Discharge Instructions (Signed)

## 2022-10-27 NOTE — Transfer of Care (Signed)
Immediate Anesthesia Transfer of Care Note  Patient: Jill Marquez  Procedure(s) Performed: CARDIOVERSION  Patient Location: PACU and Cath Lab  Anesthesia Type:General  Level of Consciousness: drowsy  Airway & Oxygen Therapy: Patient Spontanous Breathing and Patient connected to nasal cannula oxygen  Post-op Assessment: Report given to RN and Post -op Vital signs reviewed and stable  Post vital signs: Reviewed and stable  Last Vitals:  Vitals Value Taken Time  BP    Temp    Pulse    Resp    SpO2      Last Pain:  Vitals:   10/27/22 1142  TempSrc: Tympanic  PainSc: 0-No pain         Complications: No notable events documented.

## 2022-10-27 NOTE — CV Procedure (Signed)
Procedure: Electrical Cardioversion Indications:  Atrial Fibrillation  Procedure Details:  Consent: Risks of procedure as well as the alternatives and risks of each were explained to the (patient/caregiver).  Consent for procedure obtained.  Time Out: Verified patient identification, verified procedure, site/side was marked, verified correct patient position, special equipment/implants available, medications/allergies/relevent history reviewed, required imaging and test results available. PERFORMED.  Patient placed on cardiac monitor, pulse oximetry, supplemental oxygen as necessary.  Sedation given:  70 mg lidocaine, 60 mg propofol Pacer pads placed anterior and posterior chest.  Cardioverted 1 time(s).  Cardioversion with synchronized biphasic 200J shock.  Evaluation: Findings: Post procedure EKG shows: NSR Complications: None Patient did tolerate procedure well.  Time Spent Directly with the Patient:  15 minutes   Jill Marquez 10/27/2022, 12:28 PM

## 2022-10-28 ENCOUNTER — Encounter (HOSPITAL_COMMUNITY): Payer: Self-pay | Admitting: Internal Medicine

## 2022-10-28 NOTE — Anesthesia Postprocedure Evaluation (Signed)
Anesthesia Post Note  Patient: Jill Marquez  Procedure(s) Performed: CARDIOVERSION     Patient location during evaluation: PACU Anesthesia Type: General Level of consciousness: awake and alert Pain management: pain level controlled Vital Signs Assessment: post-procedure vital signs reviewed and stable Respiratory status: spontaneous breathing, nonlabored ventilation, respiratory function stable and patient connected to nasal cannula oxygen Cardiovascular status: blood pressure returned to baseline and stable Postop Assessment: no apparent nausea or vomiting Anesthetic complications: no  No notable events documented.  Last Vitals:  Vitals:   10/27/22 1244 10/27/22 1254  BP: 112/76 136/80  Pulse: 60 60  Resp: 19 19  Temp:    SpO2: 95% 95%    Last Pain:  Vitals:   10/27/22 1254  TempSrc:   PainSc: 0-No pain                 [name] S

## 2022-10-28 NOTE — Anesthesia Preprocedure Evaluation (Signed)
Anesthesia Evaluation  Patient identified by MRN, date of birth, ID band Patient awake    Reviewed: Allergy & Precautions, H&P , NPO status , Patient's Chart, lab work & pertinent test results  Airway Mallampati: II  TM Distance: >3 FB Neck ROM: Full    Dental no notable dental hx.    Pulmonary neg pulmonary ROS, former smoker   Pulmonary exam normal breath sounds clear to auscultation       Cardiovascular Normal cardiovascular exam+ dysrhythmias Atrial Fibrillation  Rhythm:Irregular Rate:Normal     Neuro/Psych negative neurological ROS  negative psych ROS   GI/Hepatic negative GI ROS, Neg liver ROS,,,  Endo/Other  negative endocrine ROS    Renal/GU negative Renal ROS  negative genitourinary   Musculoskeletal negative musculoskeletal ROS (+)    Abdominal   Peds negative pediatric ROS (+)  Hematology negative hematology ROS (+)   Anesthesia Other Findings   Reproductive/Obstetrics negative OB ROS                             Anesthesia Physical Anesthesia Plan  ASA: 2  Anesthesia Plan: General   Post-op Pain Management:    Induction: Intravenous  PONV Risk Score and Plan: Treatment may vary due to age or medical condition  Airway Management Planned: Mask  Additional Equipment:   Intra-op Plan:   Post-operative Plan: Extubation in OR  Informed Consent: I have reviewed the patients History and Physical, chart, labs and discussed the procedure including the risks, benefits and alternatives for the proposed anesthesia with the patient or authorized representative who has indicated his/her understanding and acceptance.     Dental advisory given  Plan Discussed with: CRNA and Surgeon  Anesthesia Plan Comments:        Anesthesia Quick Evaluation

## 2022-11-01 ENCOUNTER — Ambulatory Visit (HOSPITAL_COMMUNITY): Payer: PPO | Attending: Internal Medicine

## 2022-11-01 DIAGNOSIS — R0602 Shortness of breath: Secondary | ICD-10-CM

## 2022-11-01 DIAGNOSIS — I4891 Unspecified atrial fibrillation: Secondary | ICD-10-CM

## 2022-11-02 LAB — ECHOCARDIOGRAM COMPLETE
Area-P 1/2: 2.53 cm2
Est EF: 50
S' Lateral: 3.8 cm

## 2022-11-03 ENCOUNTER — Other Ambulatory Visit: Payer: Self-pay

## 2022-11-03 ENCOUNTER — Ambulatory Visit (HOSPITAL_COMMUNITY)
Admission: RE | Admit: 2022-11-03 | Discharge: 2022-11-03 | Disposition: A | Discharge status: Home / Self Care | Payer: PPO | Source: Ambulatory Visit | Attending: Internal Medicine | Admitting: Internal Medicine

## 2022-11-03 VITALS — BP 152/98 | HR 87 | Ht 62.0 in | Wt 181.4 lb

## 2022-11-03 DIAGNOSIS — Z87891 Personal history of nicotine dependence: Secondary | ICD-10-CM | POA: Diagnosis not present

## 2022-11-03 DIAGNOSIS — I4819 Other persistent atrial fibrillation: Secondary | ICD-10-CM | POA: Diagnosis present

## 2022-11-03 DIAGNOSIS — I4891 Unspecified atrial fibrillation: Secondary | ICD-10-CM

## 2022-11-03 DIAGNOSIS — I4892 Unspecified atrial flutter: Secondary | ICD-10-CM | POA: Insufficient documentation

## 2022-11-03 DIAGNOSIS — Z6833 Body mass index (BMI) 33.0-33.9, adult: Secondary | ICD-10-CM | POA: Insufficient documentation

## 2022-11-03 DIAGNOSIS — E669 Obesity, unspecified: Secondary | ICD-10-CM | POA: Insufficient documentation

## 2022-11-03 MED ORDER — AMIODARONE HCL 200 MG PO TABS: ORAL_TABLET | ORAL | 0 refills | Status: DC

## 2022-11-03 MED ORDER — METOPROLOL TARTRATE 50 MG PO TABS: 75.0000 mg | ORAL_TABLET | Freq: Two times a day (BID) | ORAL | 3 refills | Status: DC

## 2022-11-03 NOTE — H&P (View-Only) (Signed)
  Primary Care Physician: Wells, Ashleigh, MD Primary Cardiologist: None Primary Electrophysiologist: None Referring Physician: [facility]   Jill Marquez is a 72 y.o. female with no past medical history who presents for consultation in the [facility]. The patient was initially diagnosed with atrial fibrillation on 09/26/22 after presenting to [facility] for the second time. She presented to the urgent care on 3/14 for shortness of breath and was diagnosed with CAP. She presented to urgent care again on 3/25 with ongoing shortness of breath and required respiratory support in form of nasal cannula and found to be in Afib with RVR. She was transferred to ED on same day. She also had bilateral lower extremity edema and BNP was 698. CT PE was negative but found incidental pulmonary nodule. She left AMA and started on Eliquis. Established with PCP on 4/5 and started on Lopressor 25 mg BID and lasix 40 mg daily. Echocardiogram has been ordered. PCP f/u 4/12 Lopressor increased to 50 mg BID. Patient is on Eliquis 5 mg BID for a CHADS2VASC score of 2.  On evaluation 10/17/22, she is currently in Afib. She does note that she feels better since PCP increased metoprolol dosage. No worsening shortness of breath, chest pain, or edema. She definitely notes the edema has improved. Scheduled for echo at end of this month.   Denies snoring, stop breathing. She sleeps well and wakes up feeling rested.  On follow up today 11/03/22, she is currently in atrial flutter. She is s/p DCCV on 10/27/22 with successful conversion to NSR. She feels short of breath and tired particularly in the afternoon. She went back into arrhythmia sometime between 4/26 and 4/30 due to echocardiogram on 4/30 noting probable Afib at that time.   She is compliant with anticoagulation and has not missed any doses. She has no bleeding concerns.  Today, she denies symptoms of palpitations, chest pain,  shortness of breath, orthopnea, PND, lower extremity edema, dizziness, presyncope, syncope, snoring, daytime somnolence, bleeding, or neurologic sequela. The patient is tolerating medications without difficulties and is otherwise without complaint today.   Atrial Fibrillation Risk Factors:  she does not have symptoms or diagnosis of sleep apnea. The patient does not have a history of early familial atrial fibrillation or other arrhythmias.  she has a BMI of Body mass index is 33.18 kg/m.. Filed Weights   11/03/22 1500  Weight: 82.3 kg     Family History  Problem Relation Age of Onset   Hypertension Mother    Thyroid disease Mother    Heart disease Mother      Atrial Fibrillation Management history:  Previous antiarrhythmic drugs: None Previous cardioversions: 10/27/22 Previous ablations: None Anticoagulation history: Eliquis 5 mg BID    Past Medical History:  Diagnosis Date   Atrial fibrillation (HCC)    Vitamin D deficiency    Past Surgical History:  Procedure Laterality Date   CARDIOVERSION N/A 10/27/2022   Procedure: CARDIOVERSION;  Surgeon: Branch, Mary E, MD;  Location: [facility];  Service: Cardiovascular;  Laterality: N/A;    Current Outpatient Medications  Medication Sig Dispense Refill   albuterol (VENTOLIN HFA) 108 (90 Base) MCG/ACT inhaler Inhale 1-2 puffs into the lungs every 6 (six) hours as needed for wheezing or shortness of breath. (Patient taking differently: Inhale 1-2 puffs into the lungs daily.) 8 g 0   apixaban (ELIQUIS) 5 MG TABS tablet Take 1 tablet (5 mg total) by mouth 2 (two) times daily. 60   tablet 3   cholecalciferol (VITAMIN D3) 25 MCG (1000 UNIT) tablet Take 1,000 Units by mouth daily.     fluticasone (FLONASE) 50 MCG/ACT nasal spray Place 1 spray into both nostrils daily as needed for allergies or rhinitis.     furosemide (LASIX) 40 MG tablet Take 1 tablet (40 mg total) by mouth daily. 30 tablet 3   ipratropium-albuterol (DUONEB)  0.5-2.5 (3) MG/3ML SOLN Take 3 mLs by nebulization every 4 (four) hours as needed. (Patient taking differently: Take 3 mLs by nebulization daily.) 360 mL 0   metoprolol tartrate (LOPRESSOR) 50 MG tablet Take 1 tablet (50 mg total) by mouth 2 (two) times daily. 60 tablet 3   No current facility-administered medications for this encounter.    Allergies  Allergen Reactions   Pork-Derived Products Anaphylaxis    Social History   Socioeconomic History   Marital status: Single    Spouse name: Not on file   Number of children: Not on file   Years of education: Not on file   Highest education level: Some college, no degree  Occupational History   Not on file  Tobacco Use   Smoking status: Former    Types: Cigarettes   Smokeless tobacco: Never  Vaping Use   Vaping Use: Never used  Substance and Sexual Activity   Alcohol use: Not Currently   Drug use: Not Currently   Sexual activity: Not on file  Other Topics Concern   Not on file  Social History Narrative   Not on file   Social Determinants of Health   Financial Resource Strain: Low Risk  (10/13/2022)   Overall Financial Resource Strain (CARDIA)    Difficulty of Paying Living Expenses: Not very hard  Food Insecurity: No Food Insecurity (10/13/2022)   Hunger Vital Sign    Worried About Running Out of Food in the Last Year: Never true    Ran Out of Food in the Last Year: Never true  Transportation Needs: No Transportation Needs (10/13/2022)   PRAPARE - Transportation    Lack of Transportation (Medical): No    Lack of Transportation (Non-Medical): No  Physical Activity: Unknown (10/13/2022)   Exercise Vital Sign    Days of Exercise per Week: Patient declined    Minutes of Exercise per Session: Not on file  Stress: Patient Declined (10/13/2022)   [employer] - Occupational Stress Questionnaire    Feeling of Stress : Patient declined  Social Connections: Unknown (10/13/2022)   Social Connection and  Isolation Panel [NHANES]    Frequency of Communication with Friends and Family: More than three times a week    Frequency of Social Gatherings with Friends and Family: More than three times a week    Attends Religious Services: More than 4 times per year    Active Member of Clubs or Organizations: Yes    Attends Club or Organization Meetings: More than 4 times per year    Marital Status: Patient declined  Intimate Partner Violence: Not on file     ROS- All systems are reviewed and negative except as per the HPI above.  Physical Exam: Vitals:   11/03/22 1500  Pulse: 87  Weight: 82.3 kg  Height: 5' 2" (1.575 m)    GEN- The patient is well appearing, alert and oriented x 3 today.   Head- normocephalic, atraumatic Eyes-  Sclera clear, conjunctiva pink Ears- hearing intact Oropharynx- clear Neck- supple, no JVP Lymph- no cervical lymphadenopathy Lungs- Clear to ausculation bilaterally, normal work   of breathing Heart- Irregular rate and rhythm, no murmurs, rubs or gallops, PMI not laterally displaced GI- soft, NT, ND, + BS Extremities- no clubbing, cyanosis, or edema MS- no significant deformity or atrophy Skin- no rash or lesion Psych- euthymic mood, full affect Neuro- strength and sensation are intact    Wt Readings from Last 3 Encounters:  11/03/22 82.3 kg  10/27/22 80.3 kg  10/17/22 80.3 kg    EKG today demonstrates: Vent. rate 87 BPM PR interval * ms QRS duration 92 ms QT/QTcB 386/464 ms P-R-T axes 101 -34 213 Atrial flutter with variable A-V block Left axis deviation Incomplete right bundle branch block Possible Anterior infarct , age undetermined Abnormal ECG When compared with ECG of 27-Oct-2022 13:00, PREVIOUS ECG IS PRESENT  Echo 11/01/22:  1. Left ventricular ejection fraction, by estimation, is 50% with beat to  beat variability. The left ventricle has low normal function. The left  ventricle has no regional wall motion abnormalities. There is mild  left  ventricular hypertrophy. Left  ventricular diastolic parameters are indeterminate.   2. Right ventricular systolic function is mildly reduced. The right  ventricular size is normal. There is normal pulmonary artery systolic  pressure. The estimated right ventricular systolic pressure is 29.6 mmHg.   3. Left atrial size was moderately dilated.   4. Right atrial size was moderately dilated.   5. The mitral valve is grossly normal. Trivial mitral valve  regurgitation. No evidence of mitral stenosis.   6. The aortic valve is tricuspid. Aortic valve regurgitation is not  visualized. No aortic stenosis is present.   7. The inferior vena cava is normal in size with greater than 50%  respiratory variability, suggesting right atrial pressure of 3 mmHg.   Epic records are reviewed at length today.  CHA2DS2-VASc Score = 2  The patient's score is based upon: CHF History: 0 HTN History: 0 Diabetes History: 0 Stroke History: 0 Vascular Disease History: 0 Age Score: 1 Gender Score: 1       ASSESSMENT AND PLAN: Persistent Atrial Fibrillation (ICD10:  I48.19) The patient's CHA2DS2-VASc score is 2, indicating a 2.2% annual risk of stroke.   She is s/p successful DCCV on 10/27/22. (QT/Qtc 444 ms while in SR).  Unfortunately, she is in atrial flutter today.  Discussion about medication treatments and ablation and cardioversion in detail. She is not a candidate for flecainide due to baseline conduction disease on NSR ECG. I presented options of Multaq, Tikosyn (66 mL/min CrCl estimate using Creatinine 1.00 on 10/14/22 lab), or amiodarone. Caution with amiodarone due to emphysema and nodule noted on recent CT (plan noted in records to repeat CT). After discussion, she is not interested in Tikosyn or ablation at this time. She would like to try amiodarone. We will proceed with amiodarone load and the following plan:  Begin amiodarone 200 mg BID x 14 days, then 200 mg once daily. Schedule DCCV in  3 weeks. Repeat ECG and DCCV labs only in 2 weeks.  Increase metoprolol to 75 mg BID. F/U 1-2 weeks after DCCV.    Okay to have cup of coffee daily. Limit alcoholic beverages 1-2/week okay.   2. Obesity Body mass index is 33.18 kg/m. Lifestyle modification was discussed at length including regular exercise and weight reduction. Encouraged daily walking.   Repeat ECG 2 weeks.    Jayra Choyce, PA-C [facility] [address] , [place] [address] [phone] 11/03/2022 3:43 PM 

## 2022-11-03 NOTE — Patient Instructions (Signed)
Increase metoprolol to 75mg  twice a day   Start Amiodarone 200mg  -- take 1 tablet twice a day for 14 days (may 16th) then reduce to once a day - take with food    Cardioversion scheduled for: Tuesday, May 28th   - Arrive at the [facility] and go to admitting at 830am   - Do not eat or drink anything after midnight the night prior to your procedure.   - Take all your morning medication (except diabetic medications) with a sip of water prior to arrival.  - You will not be able to drive home after your procedure.    - Do NOT miss any doses of your blood thinner - if you should miss a dose please notify our office immediately.   - If you feel as if you go back into normal rhythm prior to scheduled cardioversion, please notify our office immediately.   If your procedure is canceled in the cardioversion suite you will be charged a cancellation fee.

## 2022-11-03 NOTE — Progress Notes (Signed)
Primary Care Physician: Maury Dus, MD Primary Cardiologist: None Primary Electrophysiologist: None Referring Physician: [facility]   Jill Marquez is a 72 y.o. female with no past medical history who presents for consultation in the [facility]. The patient was initially diagnosed with atrial fibrillation on 09/26/22 after presenting to [facility] for the second time. She presented to the urgent care on 3/14 for shortness of breath and was diagnosed with CAP. She presented to urgent care again on 3/25 with ongoing shortness of breath and required respiratory support in form of nasal cannula and found to be in Afib with RVR. She was transferred to ED on same day. She also had bilateral lower extremity edema and BNP was 698. CT PE was negative but found incidental pulmonary nodule. She left AMA and started on Eliquis. Established with PCP on 4/5 and started on Lopressor 25 mg BID and lasix 40 mg daily. Echocardiogram has been ordered. PCP f/u 4/12 Lopressor increased to 50 mg BID. Patient is on Eliquis 5 mg BID for a CHADS2VASC score of 2.  On evaluation 10/17/22, she is currently in Afib. She does note that she feels better since PCP increased metoprolol dosage. No worsening shortness of breath, chest pain, or edema. She definitely notes the edema has improved. Scheduled for echo at end of this month.   Denies snoring, stop breathing. She sleeps well and wakes up feeling rested.  On follow up today 11/03/22, she is currently in atrial flutter. She is s/p DCCV on 10/27/22 with successful conversion to NSR. She feels short of breath and tired particularly in the afternoon. She went back into arrhythmia sometime between 4/26 and 4/30 due to echocardiogram on 4/30 noting probable Afib at that time.   She is compliant with anticoagulation and has not missed any doses. She has no bleeding concerns.  Today, she denies symptoms of palpitations, chest pain,  shortness of breath, orthopnea, PND, lower extremity edema, dizziness, presyncope, syncope, snoring, daytime somnolence, bleeding, or neurologic sequela. The patient is tolerating medications without difficulties and is otherwise without complaint today.   Atrial Fibrillation Risk Factors:  she does not have symptoms or diagnosis of sleep apnea. The patient does not have a history of early familial atrial fibrillation or other arrhythmias.  she has a BMI of Body mass index is 33.18 kg/m.Marland Kitchen Filed Weights   11/03/22 1500  Weight: 82.3 kg     Family History  Problem Relation Age of Onset   Hypertension Mother    Thyroid disease Mother    Heart disease Mother      Atrial Fibrillation Management history:  Previous antiarrhythmic drugs: None Previous cardioversions: 10/27/22 Previous ablations: None Anticoagulation history: Eliquis 5 mg BID    Past Medical History:  Diagnosis Date   Atrial fibrillation (HCC)    Vitamin D deficiency    Past Surgical History:  Procedure Laterality Date   CARDIOVERSION N/A 10/27/2022   Procedure: CARDIOVERSION;  Surgeon: [name], MD;  Location: [facility];  Service: Cardiovascular;  Laterality: N/A;    Current Outpatient Medications  Medication Sig Dispense Refill   albuterol (VENTOLIN HFA) 108 (90 Base) MCG/ACT inhaler Inhale 1-2 puffs into the lungs every 6 (six) hours as needed for wheezing or shortness of breath. (Patient taking differently: Inhale 1-2 puffs into the lungs daily.) 8 g 0   apixaban (ELIQUIS) 5 MG TABS tablet Take 1 tablet (5 mg total) by mouth 2 (two) times daily. 60  tablet 3   cholecalciferol (VITAMIN D3) 25 MCG (1000 UNIT) tablet Take 1,000 Units by mouth daily.     fluticasone (FLONASE) 50 MCG/ACT nasal spray Place 1 spray into both nostrils daily as needed for allergies or rhinitis.     furosemide (LASIX) 40 MG tablet Take 1 tablet (40 mg total) by mouth daily. 30 tablet 3   ipratropium-albuterol (DUONEB)  0.5-2.5 (3) MG/3ML SOLN Take 3 mLs by nebulization every 4 (four) hours as needed. (Patient taking differently: Take 3 mLs by nebulization daily.) 360 mL 0   metoprolol tartrate (LOPRESSOR) 50 MG tablet Take 1 tablet (50 mg total) by mouth 2 (two) times daily. 60 tablet 3   No current facility-administered medications for this encounter.    Allergies  Allergen Reactions   Pork-Derived Products Anaphylaxis    Social History   Socioeconomic History   Marital status: Single    Spouse name: Not on file   Number of children: Not on file   Years of education: Not on file   Highest education level: Some college, no degree  Occupational History   Not on file  Tobacco Use   Smoking status: Former    Types: Cigarettes   Smokeless tobacco: Never  Vaping Use   Vaping Use: Never used  Substance and Sexual Activity   Alcohol use: Not Currently   Drug use: Not Currently   Sexual activity: Not on file  Other Topics Concern   Not on file  Social History Narrative   Not on file   Social Determinants of Health   Financial Resource Strain: Low Risk  (10/13/2022)   Overall Financial Resource Strain (CARDIA)    Difficulty of Paying Living Expenses: Not very hard  Food Insecurity: No Food Insecurity (10/13/2022)   Hunger Vital Sign    Worried About Running Out of Food in the Last Year: Never true    Ran Out of Food in the Last Year: Never true  Transportation Needs: No Transportation Needs (10/13/2022)   PRAPARE - Administrator, Civil Service (Medical): No    Lack of Transportation (Non-Medical): No  Physical Activity: Unknown (10/13/2022)   Exercise Vital Sign    Days of Exercise per Week: Patient declined    Minutes of Exercise per Session: Not on file  Stress: Patient Declined (10/13/2022)   [name] of Occupational Health - Occupational Stress Questionnaire    Feeling of Stress : Patient declined  Social Connections: Unknown (10/13/2022)   Social Connection and  Isolation Panel [NHANES]    Frequency of Communication with Friends and Family: More than three times a week    Frequency of Social Gatherings with Friends and Family: More than three times a week    Attends Religious Services: More than 4 times per year    Active Member of [employer] or Organizations: Yes    Attends Engineer, structural: More than 4 times per year    Marital Status: Patient declined  Intimate Partner Violence: Not on file     ROS- All systems are reviewed and negative except as per the HPI above.  Physical Exam: Vitals:   11/03/22 1500  Pulse: 87  Weight: 82.3 kg  Height: 5\' 2"  (1.575 m)    GEN- The patient is well appearing, alert and oriented x 3 today.   Head- normocephalic, atraumatic Eyes-  Sclera clear, conjunctiva pink Ears- hearing intact Oropharynx- clear Neck- supple, no JVP Lymph- no cervical lymphadenopathy Lungs- Clear to ausculation bilaterally, normal work  of breathing Heart- Irregular rate and rhythm, no murmurs, rubs or gallops, PMI not laterally displaced GI- soft, NT, ND, + BS Extremities- no clubbing, cyanosis, or edema MS- no significant deformity or atrophy Skin- no rash or lesion Psych- euthymic mood, full affect Neuro- strength and sensation are intact    Wt Readings from Last 3 Encounters:  11/03/22 82.3 kg  10/27/22 80.3 kg  10/17/22 80.3 kg    EKG today demonstrates: Vent. rate 87 BPM PR interval * ms QRS duration 92 ms QT/QTcB 386/464 ms P-R-T axes 101 -34 213 Atrial flutter with variable A-V block Left axis deviation Incomplete right bundle branch block Possible Anterior infarct , age undetermined Abnormal ECG When compared with ECG of 27-Oct-2022 13:00, PREVIOUS ECG IS PRESENT  Echo 11/01/22:  1. Left ventricular ejection fraction, by estimation, is 50% with beat to  beat variability. The left ventricle has low normal function. The left  ventricle has no regional wall motion abnormalities. There is mild  left  ventricular hypertrophy. Left  ventricular diastolic parameters are indeterminate.   2. Right ventricular systolic function is mildly reduced. The right  ventricular size is normal. There is normal pulmonary artery systolic  pressure. The estimated right ventricular systolic pressure is 29.6 mmHg.   3. Left atrial size was moderately dilated.   4. Right atrial size was moderately dilated.   5. The mitral valve is grossly normal. Trivial mitral valve  regurgitation. No evidence of mitral stenosis.   6. The aortic valve is tricuspid. Aortic valve regurgitation is not  visualized. No aortic stenosis is present.   7. The inferior vena cava is normal in size with greater than 50%  respiratory variability, suggesting right atrial pressure of 3 mmHg.   Epic records are reviewed at length today.  CHA2DS2-VASc Score = 2  The patient's score is based upon: CHF History: 0 HTN History: 0 Diabetes History: 0 Stroke History: 0 Vascular Disease History: 0 Age Score: 1 Gender Score: 1       ASSESSMENT AND PLAN: Persistent Atrial Fibrillation (ICD10:  I48.19) The patient's CHA2DS2-VASc score is 2, indicating a 2.2% annual risk of stroke.   She is s/p successful DCCV on 10/27/22. (QT/Qtc 444 ms while in SR).  Unfortunately, she is in atrial flutter today.  Discussion about medication treatments and ablation and cardioversion in detail. She is not a candidate for flecainide due to baseline conduction disease on NSR ECG. I presented options of Multaq, Tikosyn (66 mL/min CrCl estimate using Creatinine 1.00 on 10/14/22 lab), or amiodarone. Caution with amiodarone due to emphysema and nodule noted on recent CT (plan noted in records to repeat CT). After discussion, she is not interested in Tikosyn or ablation at this time. She would like to try amiodarone. We will proceed with amiodarone load and the following plan:  Begin amiodarone 200 mg BID x 14 days, then 200 mg once daily. Schedule DCCV in  3 weeks. Repeat ECG and DCCV labs only in 2 weeks.  Increase metoprolol to 75 mg BID. F/U 1-2 weeks after DCCV.    Okay to have cup of coffee daily. Limit alcoholic beverages 1-2/week okay.   2. Obesity Body mass index is 33.18 kg/m. Lifestyle modification was discussed at length including regular exercise and weight reduction. Encouraged daily walking.   Repeat ECG 2 weeks.    Lake Bells, PA-C [facility] [address] [place], [place] [address] [phone] 11/03/2022 3:43 PM

## 2022-11-04 ENCOUNTER — Other Ambulatory Visit: Payer: Self-pay

## 2022-11-04 MED ORDER — IPRATROPIUM-ALBUTEROL 0.5-2.5 (3) MG/3ML IN SOLN: 3.0000 mL | RESPIRATORY_TRACT | 0 refills | Status: AC | PRN

## 2022-11-04 MED ORDER — ALBUTEROL SULFATE HFA 108 (90 BASE) MCG/ACT IN AERS: 1.0000 | INHALATION_SPRAY | Freq: Four times a day (QID) | RESPIRATORY_TRACT | 0 refills | Status: DC | PRN

## 2022-11-11 ENCOUNTER — Ambulatory Visit: Payer: PPO | Admitting: Family Medicine

## 2022-11-17 ENCOUNTER — Telehealth: Payer: Self-pay | Admitting: Family Medicine

## 2022-11-17 ENCOUNTER — Ambulatory Visit (HOSPITAL_COMMUNITY)
Admission: RE | Admit: 2022-11-17 | Discharge: 2022-11-17 | Disposition: A | Discharge status: Home / Self Care | Payer: PPO | Source: Ambulatory Visit | Attending: Internal Medicine | Admitting: Internal Medicine

## 2022-11-17 VITALS — HR 71

## 2022-11-17 DIAGNOSIS — Z79899 Other long term (current) drug therapy: Secondary | ICD-10-CM | POA: Insufficient documentation

## 2022-11-17 DIAGNOSIS — I4892 Unspecified atrial flutter: Secondary | ICD-10-CM | POA: Diagnosis not present

## 2022-11-17 DIAGNOSIS — I4891 Unspecified atrial fibrillation: Secondary | ICD-10-CM | POA: Diagnosis present

## 2022-11-17 DIAGNOSIS — I4439 Other atrioventricular block: Secondary | ICD-10-CM | POA: Diagnosis not present

## 2022-11-17 LAB — CBC
HCT: 51.9 % — ABNORMAL HIGH (ref 36.0–46.0)
Hemoglobin: 16.6 g/dL — ABNORMAL HIGH (ref 12.0–15.0)
MCH: 30 pg (ref 26.0–34.0)
MCHC: 32 g/dL (ref 30.0–36.0)
MCV: 93.7 fL (ref 80.0–100.0)
Platelets: 175 10*3/uL (ref 150–400)
RBC: 5.54 MIL/uL — ABNORMAL HIGH (ref 3.87–5.11)
RDW: 13.5 % (ref 11.5–15.5)
WBC: 6.7 10*3/uL (ref 4.0–10.5)
nRBC: 0 % (ref 0.0–0.2)

## 2022-11-17 LAB — BASIC METABOLIC PANEL
Anion gap: 8 (ref 5–15)
BUN: 18 mg/dL (ref 8–23)
CO2: 31 mmol/L (ref 22–32)
Calcium: 9.5 mg/dL (ref 8.9–10.3)
Chloride: 99 mmol/L (ref 98–111)
Creatinine, Ser: 1.08 mg/dL — ABNORMAL HIGH (ref 0.44–1.00)
GFR, Estimated: 55 mL/min — ABNORMAL LOW (ref >60)
Glucose, Bld: 105 mg/dL — ABNORMAL HIGH (ref 70–99)
Potassium: 4 mmol/L (ref 3.5–5.1)
Sodium: 138 mmol/L (ref 135–145)

## 2022-11-17 NOTE — Progress Notes (Signed)
Patient is currently taking amiodarone and will transition to amiodarone once daily tomorrow.   She feels better compared to before but will still get short of breath or have to stop to rest if she does too much activity.   Labs drawn today.   ECG today shows:  Vent. rate 71 BPM PR interval * ms QRS duration 98 ms QT/QTcB 422/458 ms P-R-T axes * -49 206 Atrial flutter with variable A-V block Left axis deviation Possible Anterior infarct , age undetermined ST & T wave abnormality, consider lateral ischemia Abnormal ECG When compared with ECG of 03-Nov-2022 15:35, PREVIOUS ECG IS PRESENT   Pt will follow up as scheduled for DCCV.

## 2022-11-17 NOTE — Telephone Encounter (Signed)
Called patient to schedule Medicare Annual Wellness Visit (AWV). Left message for patient to call back and schedule Medicare Annual Wellness Visit (AWV).  Last date of AWV: AWVI eligible as of 04/03/2018  Please schedule an AWVI appointment at any time with [facility] VISIT.  If any questions, please contact me at [phone].    Thank you,  [facility] Support [facility] Direct dial  [phone]

## 2022-11-25 NOTE — Progress Notes (Signed)
Spoke with  patient, Procedure scheduled for 0930, Please arrive at the hospital at 0830     , NPO after midnight on Monday,  May take meds with sips of water in the AM, please have transportation for home post procedure, and someone to stay with pt for approximately 24 hours after  Pt states she has been taking eliquis regularly with no missed doses

## 2022-11-28 NOTE — Anesthesia Preprocedure Evaluation (Signed)
Anesthesia Evaluation  Patient identified by MRN, date of birth, ID band Patient awake    Reviewed: Allergy & Precautions, NPO status , Patient's Chart, lab work & pertinent test results, reviewed documented beta blocker date and time   Airway Mallampati: III  TM Distance: >3 FB Neck ROM: Full    Dental  (+) Teeth Intact, Dental Advisory Given   Pulmonary asthma , former smoker   Pulmonary exam normal breath sounds clear to auscultation       Cardiovascular hypertension, Pt. on medications and Pt. on home beta blockers + dysrhythmias Atrial Fibrillation  Rhythm:Irregular Rate:Abnormal     Neuro/Psych negative neurological ROS     GI/Hepatic negative GI ROS, Neg liver ROS,,,  Endo/Other  negative endocrine ROS    Renal/GU negative Renal ROS     Musculoskeletal negative musculoskeletal ROS (+)    Abdominal   Peds  Hematology  (+) Blood dyscrasia (Eliqus)   Anesthesia Other Findings   Reproductive/Obstetrics                             Anesthesia Physical Anesthesia Plan  ASA: 3  Anesthesia Plan: General   Post-op Pain Management: Minimal or no pain anticipated   Induction: Intravenous  PONV Risk Score and Plan: 3 and TIVA and Treatment may vary due to age or medical condition  Airway Management Planned: Mask  Additional Equipment:   Intra-op Plan:   Post-operative Plan:   Informed Consent: I have reviewed the patients History and Physical, chart, labs and discussed the procedure including the risks, benefits and alternatives for the proposed anesthesia with the patient or authorized representative who has indicated his/her understanding and acceptance.     Dental advisory given  Plan Discussed with: CRNA  Anesthesia Plan Comments:        Anesthesia Quick Evaluation

## 2022-11-29 ENCOUNTER — Encounter (HOSPITAL_COMMUNITY): Payer: Self-pay | Admitting: Internal Medicine

## 2022-11-29 ENCOUNTER — Ambulatory Visit (HOSPITAL_BASED_OUTPATIENT_CLINIC_OR_DEPARTMENT_OTHER): Payer: PPO | Admitting: Anesthesiology

## 2022-11-29 ENCOUNTER — Ambulatory Visit (HOSPITAL_COMMUNITY)
Admission: RE | Admit: 2022-11-29 | Discharge: 2022-11-29 | Disposition: A | Discharge status: Home / Self Care | Payer: PPO | Attending: Internal Medicine | Admitting: Internal Medicine

## 2022-11-29 ENCOUNTER — Encounter (HOSPITAL_COMMUNITY)
Admission: RE | Disposition: A | Discharge status: Home / Self Care | Payer: Self-pay | Source: Home / Self Care | Attending: Internal Medicine

## 2022-11-29 ENCOUNTER — Other Ambulatory Visit: Payer: Self-pay

## 2022-11-29 ENCOUNTER — Ambulatory Visit (HOSPITAL_COMMUNITY): Payer: PPO | Admitting: Anesthesiology

## 2022-11-29 DIAGNOSIS — Z09 Encounter for follow-up examination after completed treatment for conditions other than malignant neoplasm: Secondary | ICD-10-CM | POA: Diagnosis not present

## 2022-11-29 DIAGNOSIS — J45909 Unspecified asthma, uncomplicated: Secondary | ICD-10-CM | POA: Diagnosis not present

## 2022-11-29 DIAGNOSIS — Z87891 Personal history of nicotine dependence: Secondary | ICD-10-CM | POA: Insufficient documentation

## 2022-11-29 DIAGNOSIS — Z79899 Other long term (current) drug therapy: Secondary | ICD-10-CM | POA: Diagnosis not present

## 2022-11-29 DIAGNOSIS — I4891 Unspecified atrial fibrillation: Secondary | ICD-10-CM

## 2022-11-29 DIAGNOSIS — I10 Essential (primary) hypertension: Secondary | ICD-10-CM | POA: Diagnosis not present

## 2022-11-29 DIAGNOSIS — Z7901 Long term (current) use of anticoagulants: Secondary | ICD-10-CM | POA: Diagnosis not present

## 2022-11-29 HISTORY — PX: CARDIOVERSION: SHX1299

## 2022-11-29 SURGERY — CARDIOVERSION
Anesthesia: General

## 2022-11-29 MED ORDER — SODIUM CHLORIDE 0.9 % IV SOLN: INTRAVENOUS | Status: DC

## 2022-11-29 MED ORDER — LIDOCAINE 2% (20 MG/ML) 5 ML SYRINGE
INTRAMUSCULAR | Status: DC | PRN
  Administered 2022-11-29: 60 mg via INTRAVENOUS

## 2022-11-29 MED ORDER — PROPOFOL 10 MG/ML IV BOLUS
INTRAVENOUS | Status: DC | PRN
  Administered 2022-11-29: 60 mg via INTRAVENOUS
  Administered 2022-11-29: 15 mg via INTRAVENOUS

## 2022-11-29 SURGICAL SUPPLY — 1 items: ELECT DEFIB PAD ADLT CADENCE (PAD) ×2 IMPLANT

## 2022-11-29 NOTE — Interval H&P Note (Signed)
History and Physical Interval Note:  11/29/2022 8:54 AM  Jill Marquez  has presented today for surgery, with the diagnosis of AFIB.  The various methods of treatment have been discussed with the patient and family. After consideration of risks, benefits and other options for treatment, the patient has consented to  Procedure(s): CARDIOVERSION (N/A) as a surgical intervention.  The patient's history has been reviewed, patient examined, no change in status, stable for surgery.  I have reviewed the patient's chart and labs.  Questions were answered to the patient's satisfaction.     Parke Poisson

## 2022-11-29 NOTE — CV Procedure (Signed)
Procedure: Electrical Cardioversion Indications:  Atrial Fibrillation  Procedure Details:  Consent: Risks of procedure as well as the alternatives and risks of each were explained to the (patient/caregiver).  Consent for procedure obtained.  Time Out: Verified patient identification, verified procedure, site/side was marked, verified correct patient position, special equipment/implants available, medications/allergies/relevent history reviewed, required imaging and test results available. PERFORMED.  Patient placed on cardiac monitor, pulse oximetry, supplemental oxygen as necessary.  Sedation given:  propofol per anesthesia Pacer pads placed anterior and posterior chest.  Cardioverted 3 time(s).  Cardioversion with synchronized biphasic 120J, 200J, 200J shock.  Evaluation: Findings: Post procedure EKG shows:  sinus bradycardia Complications: None Patient did tolerate procedure well.  Time Spent Directly with the Patient:  30 minutes   Parke Poisson 11/29/2022, 9:12 AM

## 2022-11-29 NOTE — Progress Notes (Signed)
Pt sinus bradycardia post DCCV, HR 39-42. MD aware. Pt asymptomatic.

## 2022-11-29 NOTE — Anesthesia Postprocedure Evaluation (Signed)
Anesthesia Post Note  Patient: Jill Marquez  Procedure(s) Performed: CARDIOVERSION     Patient location during evaluation: Cath Lab Anesthesia Type: General Level of consciousness: awake and alert Pain management: pain level controlled Vital Signs Assessment: post-procedure vital signs reviewed and stable Respiratory status: spontaneous breathing, nonlabored ventilation, respiratory function stable and patient connected to nasal cannula oxygen Cardiovascular status: blood pressure returned to baseline and stable Postop Assessment: no apparent nausea or vomiting Anesthetic complications: no   No notable events documented.  Last Vitals:  Vitals:   11/29/22 0930 11/29/22 0940  BP: 107/66 (!) 116/100  Pulse: (!) 38 (!) 40  Resp: 14 18  Temp:    SpO2: 94% 92%    Last Pain:  Vitals:   11/29/22 0920  TempSrc:   PainSc: 0-No pain                 Collene Schlichter

## 2022-11-29 NOTE — Transfer of Care (Signed)
Immediate Anesthesia Transfer of Care Note  Patient: Jill Marquez  Procedure(s) Performed: CARDIOVERSION  Patient Location: PACU and Cath Lab  Anesthesia Type:MAC  Level of Consciousness: sedated  Airway & Oxygen Therapy: Patient Spontanous Breathing and Patient connected to nasal cannula oxygen  Post-op Assessment: Report given to RN and Post -op Vital signs reviewed and stable  Post vital signs: Reviewed and stable  Last Vitals:  Vitals Value Taken Time  BP    Temp    Pulse 66 11/29/22 0856  Resp 19 11/29/22 0856  SpO2 92 % 11/29/22 0856  Vitals shown include unvalidated device data.  Last Pain:  Vitals:   11/29/22 0851  TempSrc:   PainSc: 0-No pain         Complications: No notable events documented.

## 2022-11-30 ENCOUNTER — Other Ambulatory Visit (HOSPITAL_COMMUNITY): Payer: Self-pay | Admitting: *Deleted

## 2022-11-30 ENCOUNTER — Encounter (HOSPITAL_COMMUNITY): Payer: Self-pay | Admitting: Internal Medicine

## 2022-11-30 MED ORDER — APIXABAN 5 MG PO TABS: 5.0000 mg | ORAL_TABLET | Freq: Two times a day (BID) | ORAL | 3 refills | Status: DC

## 2022-11-30 MED ORDER — AMIODARONE HCL 200 MG PO TABS: 200.0000 mg | ORAL_TABLET | Freq: Every day | ORAL | 6 refills | Status: DC

## 2022-12-07 ENCOUNTER — Ambulatory Visit (HOSPITAL_COMMUNITY)
Admission: RE | Admit: 2022-12-07 | Discharge: 2022-12-07 | Disposition: A | Discharge status: Home / Self Care | Payer: PPO | Source: Ambulatory Visit | Attending: Internal Medicine | Admitting: Internal Medicine

## 2022-12-07 ENCOUNTER — Encounter (HOSPITAL_COMMUNITY): Payer: Self-pay | Admitting: Internal Medicine

## 2022-12-07 VITALS — BP 136/82 | HR 79 | Ht 63.0 in | Wt 183.4 lb

## 2022-12-07 DIAGNOSIS — E669 Obesity, unspecified: Secondary | ICD-10-CM | POA: Diagnosis not present

## 2022-12-07 DIAGNOSIS — I4819 Other persistent atrial fibrillation: Secondary | ICD-10-CM | POA: Diagnosis not present

## 2022-12-07 DIAGNOSIS — I4892 Unspecified atrial flutter: Secondary | ICD-10-CM | POA: Diagnosis not present

## 2022-12-07 DIAGNOSIS — Z6832 Body mass index (BMI) 32.0-32.9, adult: Secondary | ICD-10-CM | POA: Diagnosis not present

## 2022-12-07 DIAGNOSIS — I483 Typical atrial flutter: Secondary | ICD-10-CM

## 2022-12-07 DIAGNOSIS — I4891 Unspecified atrial fibrillation: Secondary | ICD-10-CM | POA: Diagnosis not present

## 2022-12-07 DIAGNOSIS — Z7901 Long term (current) use of anticoagulants: Secondary | ICD-10-CM | POA: Insufficient documentation

## 2022-12-07 MED ORDER — AMIODARONE HCL 200 MG PO TABS: 200.0000 mg | ORAL_TABLET | Freq: Two times a day (BID) | ORAL | 0 refills | Status: DC

## 2022-12-07 NOTE — Patient Instructions (Signed)
Increase amiodarone to 200mg twice a day   

## 2022-12-07 NOTE — Progress Notes (Addendum)
Primary Care Physician: Maury Dus, MD Primary Cardiologist: None Primary Electrophysiologist: None Referring Physician: [facility]   Jill Marquez is a 72 y.o. female with no past medical history who presents for consultation in the [facility]. The patient was initially diagnosed with atrial fibrillation on 09/26/22 after presenting to [facility] for the second time. She presented to the urgent care on 3/14 for shortness of breath and was diagnosed with CAP. She presented to urgent care again on 3/25 with ongoing shortness of breath and required respiratory support in form of nasal cannula and found to be in Afib with RVR. She was transferred to ED on same day. She also had bilateral lower extremity edema and BNP was 698. CT PE was negative but found incidental pulmonary nodule. She left AMA and started on Eliquis. Established with PCP on 4/5 and started on Lopressor 25 mg BID and lasix 40 mg daily. Echocardiogram has been ordered. PCP f/u 4/12 Lopressor increased to 50 mg BID. Patient is on Eliquis 5 mg BID for a CHADS2VASC score of 2.  On evaluation 10/17/22, she is currently in Afib. She does note that she feels better since PCP increased metoprolol dosage. No worsening shortness of breath, chest pain, or edema. She definitely notes the edema has improved. Scheduled for echo at end of this month.   Denies snoring, stop breathing. She sleeps well and wakes up feeling rested.  On follow up today 11/03/22, she is currently in atrial flutter. She is s/p DCCV on 10/27/22 with successful conversion to NSR. She feels short of breath and tired particularly in the afternoon. She went back into arrhythmia sometime between 4/26 and 4/30 due to echocardiogram on 4/30 noting probable Afib at that time.   She is compliant with anticoagulation and has not missed any doses. She has no bleeding concerns.  On follow up 12/07/22, she is currently in rate controlled  atrial flutter. She is s/p DCCV on 11/29/22 with successful conversion to NSR. She is currently taking amiodarone 200 mg once daily. She could feel she went out of rhythm several days ago. However, she states she feels much better than before while taking amiodarone. She specifically states she can do her daily activities without interruption being on amiodarone. She is not interested in an ablation at this time. No missed doses of Eliquis 5 mg BID.   Today, she denies symptoms of palpitations, chest pain, shortness of breath, orthopnea, PND, lower extremity edema, dizziness, presyncope, syncope, snoring, daytime somnolence, bleeding, or neurologic sequela. The patient is tolerating medications without difficulties and is otherwise without complaint today.   Atrial Fibrillation Risk Factors:  she does not have symptoms or diagnosis of sleep apnea. The patient does not have a history of early familial atrial fibrillation or other arrhythmias.  she has a BMI of Body mass index is 32.49 kg/m.Marland Kitchen Filed Weights   12/07/22 1552  Weight: 83.2 kg      Family History  Problem Relation Age of Onset   Hypertension Mother    Thyroid disease Mother    Heart disease Mother     Atrial Fibrillation Management history:  Previous antiarrhythmic drugs: amiodarone 200 mg daily Previous cardioversions: 10/27/22 Previous ablations: None Anticoagulation history: Eliquis 5 mg BID    Past Medical History:  Diagnosis Date   Atrial fibrillation (HCC)    Vitamin D deficiency    Past Surgical History:  Procedure Laterality Date   CARDIOVERSION N/A 10/27/2022   Procedure:  CARDIOVERSION;  Surgeon: [name], MD;  Location[facility]VASIVE CV LAB;  Service: Cardiovascular;  Laterality: N/A;   CARDIOVERSION N/A 11/29/2022   Procedure: CARDIOVERSION;  Surgeon: Parke Poisson, MD;  Location: [facility] INVASIVE CV LAB;  Service: Cardiovascular;  Laterality: N/A;    Current Outpatient Medications  Medication Sig  Dispense Refill   albuterol (VENTOLIN HFA) 108 (90 Base) MCG/ACT inhaler Inhale 1-2 puffs into the lungs every 6 (six) hours as needed for wheezing or shortness of breath. 8 g 0   apixaban (ELIQUIS) 5 MG TABS tablet Take 1 tablet (5 mg total) by mouth 2 (two) times daily. 60 tablet 3   fluticasone (FLONASE) 50 MCG/ACT nasal spray Place 1 spray into both nostrils daily as needed for allergies or rhinitis.     ipratropium-albuterol (DUONEB) 0.5-2.5 (3) MG/3ML SOLN Take 3 mLs by nebulization every 4 (four) hours as needed. 90 mL 0   amiodarone (PACERONE) 200 MG tablet Take 1 tablet (200 mg total) by mouth 2 (two) times daily. 60 tablet 0   cholecalciferol (VITAMIN D3) 25 MCG (1000 UNIT) tablet Take 1,000 Units by mouth daily. (Patient not taking: Reported on 12/07/2022)     furosemide (LASIX) 40 MG tablet Take 1 tablet (40 mg total) by mouth daily. (Patient not taking: Reported on 12/07/2022) 30 tablet 3   No current facility-administered medications for this encounter.    Allergies  Allergen Reactions   Pork-Derived Products Anaphylaxis    Social History   Socioeconomic History   Marital status: Single    Spouse name: Not on file   Number of children: Not on file   Years of education: Not on file   Highest education level: Some college, no degree  Occupational History   Not on file  Tobacco Use   Smoking status: Former    Types: Cigarettes   Smokeless tobacco: Never  Vaping Use   Vaping Use: Never used  Substance and Sexual Activity   Alcohol use: Not Currently   Drug use: Not Currently   Sexual activity: Not on file  Other Topics Concern   Not on file  Social History Narrative   Not on file   Social Determinants of Health   Financial Resource Strain: Low Risk  (10/13/2022)   Overall Financial Resource Strain (CARDIA)    Difficulty of Paying Living Expenses: Not very hard  Food Insecurity: No Food Insecurity (10/13/2022)   Hunger Vital Sign    Worried About Running Out of  Food in the Last Year: Never true    Ran Out of Food in the Last Year: Never true  Transportation Needs: No Transportation Needs (10/13/2022)   PRAPARE - Administrator, Civil Service (Medical): No    Lack of Transportation (Non-Medical): No  Physical Activity: Unknown (10/13/2022)   Exercise Vital Sign    Days of Exercise per Week: Patient declined    Minutes of Exercise per Session: Not on file  Stress: Patient Declined (10/13/2022)   [name] of Occupational Health - Occupational Stress Questionnaire    Feeling of Stress : Patient declined  Social Connections: Unknown (10/13/2022)   Social Connection and Isolation Panel [NHANES]    Frequency of Communication with Friends and Family: More than three times a week    Frequency of Social Gatherings with Friends and Family: More than three times a week    Attends Religious Services: More than 4 times per year    Active Member of [employer] or Organizations: Yes  Attends Banker Meetings: More than 4 times per year    Marital Status: Patient declined  Intimate Partner Violence: Not on file     ROS- All systems are reviewed and negative except as per the HPI above.  Physical Exam: Vitals:   12/07/22 1552  BP: 136/82  Pulse: 79  Weight: 83.2 kg  Height: 5\' 3"  (1.6 m)    GEN- The patient is well appearing, alert and oriented x 3 today.   Head- normocephalic, atraumatic Eyes-  Sclera clear, conjunctiva pink Ears- hearing intact Lungs- Clear to ausculation bilaterally, normal work of breathing Heart- Irregular rate and rhythm, no murmurs, rubs or gallops, PMI not laterally displaced Extremities- no clubbing, cyanosis, or edema MS- no significant deformity or atrophy Skin- no rash or lesion Psych- euthymic mood, full affect Neuro- strength and sensation are intact   Wt Readings from Last 3 Encounters:  12/07/22 83.2 kg  11/29/22 79.8 kg  11/03/22 82.3 kg    EKG today demonstrates: Vent. rate 79  BPM PR interval * ms QRS duration 92 ms QT/QTcB 448/513 ms P-R-T axes 89 -47 127 Atrial flutter with variable A-V block Left axis deviation Cannot rule out Anterior infarct , age undetermined Prolonged QT Abnormal ECG When compared with ECG of 29-Nov-2022 09:21, PREVIOUS ECG IS PRESENT  Echo 11/01/22:  1. Left ventricular ejection fraction, by estimation, is 50% with beat to  beat variability. The left ventricle has low normal function. The left  ventricle has no regional wall motion abnormalities. There is mild left  ventricular hypertrophy. Left  ventricular diastolic parameters are indeterminate.   2. Right ventricular systolic function is mildly reduced. The right  ventricular size is normal. There is normal pulmonary artery systolic  pressure. The estimated right ventricular systolic pressure is 29.6 mmHg.   3. Left atrial size was moderately dilated.   4. Right atrial size was moderately dilated.   5. The mitral valve is grossly normal. Trivial mitral valve  regurgitation. No evidence of mitral stenosis.   6. The aortic valve is tricuspid. Aortic valve regurgitation is not  visualized. No aortic stenosis is present.   7. The inferior vena cava is normal in size with greater than 50%  respiratory variability, suggesting right atrial pressure of 3 mmHg.   Epic records are reviewed at length today.  CHA2DS2-VASc Score = 2  The patient's score is based upon: CHF History: 0 HTN History: 0 Diabetes History: 0 Stroke History: 0 Vascular Disease History: 0 Age Score: 1 Gender Score: 1       ASSESSMENT AND PLAN: Persistent Atrial Fibrillation (ICD10:  I48.19) / atrial flutter The patient's CHA2DS2-VASc score is 2, indicating a 2.2% annual risk of stroke.   She is s/p successful DCCV on 10/27/22. (QT/Qtc 444 ms while in SR). S/p DCCV on 11/29/22 with successful conversion to NSR x 3 shocks.  She is in rate controlled atrial flutter today. She cannot take flecainide due to  baseline conduction disease. She is still not interested in an ablation. She would like to continue amiodarone because she notes feeling much better overall taking it. After discussion, we will attempt reload of amiodarone. She will begin amiodarone 200 mg BID x 1 month and we will reassess in 1 month.   Continue Eliquis 5 mg BID.  Okay to have cup of coffee daily. Limit alcoholic beverages 1-2/week okay.   2. Obesity Body mass index is 32.49 kg/m. Lifestyle modification was discussed at length including regular exercise and weight  reduction. Encouraged daily walking.  F/u in 1 month.    L[place] Bells, PA-C [facility] [address] [place], [place] [address] [phone] 12/07/2022 4:30 PM

## 2022-12-23 ENCOUNTER — Encounter: Payer: Self-pay | Admitting: Family Medicine

## 2022-12-23 ENCOUNTER — Ambulatory Visit (INDEPENDENT_AMBULATORY_CARE_PROVIDER_SITE_OTHER): Payer: PPO | Admitting: Family Medicine

## 2022-12-23 VITALS — BP 164/129 | HR 87 | Wt 180.6 lb

## 2022-12-23 DIAGNOSIS — Z0000 Encounter for general adult medical examination without abnormal findings: Secondary | ICD-10-CM

## 2022-12-23 DIAGNOSIS — R030 Elevated blood-pressure reading, without diagnosis of hypertension: Secondary | ICD-10-CM

## 2022-12-23 DIAGNOSIS — Z1159 Encounter for screening for other viral diseases: Secondary | ICD-10-CM | POA: Diagnosis not present

## 2022-12-23 DIAGNOSIS — Z1211 Encounter for screening for malignant neoplasm of colon: Secondary | ICD-10-CM

## 2022-12-23 DIAGNOSIS — Z131 Encounter for screening for diabetes mellitus: Secondary | ICD-10-CM

## 2022-12-23 DIAGNOSIS — Z13220 Encounter for screening for lipoid disorders: Secondary | ICD-10-CM | POA: Diagnosis not present

## 2022-12-23 DIAGNOSIS — Z13820 Encounter for screening for osteoporosis: Secondary | ICD-10-CM

## 2022-12-23 DIAGNOSIS — Z1231 Encounter for screening mammogram for malignant neoplasm of breast: Secondary | ICD-10-CM

## 2022-12-23 NOTE — Progress Notes (Unsigned)
    SUBJECTIVE:   Chief complaint/HPI: annual examination  Jill Marquez is a 72 y.o. who presents today for an annual exam.   History tabs reviewed and updated ***.   Review of systems form reviewed and notable for ***.   OBJECTIVE:   BP (!) 140/86 (BP Location: Left Arm, Patient Position: Sitting, Cuff Size: Normal)   Pulse 87   Wt 180 lb 9.6 oz (81.9 kg)   SpO2 91%   BMI 31.99 kg/m   ***  ASSESSMENT/PLAN:   No problem-specific Assessment & Plan notes found for this encounter.    Annual Examination  See AVS for age appropriate recommendations  PHQ score ***, reviewed and discussed.  BP reviewed and at goal ***.  Asked about intimate partner violence and resources given as appropriate  Advance directives discussion ***  Considered the following items based upon USPSTF recommendations: Diabetes screening: {discussed/ordered:14545} Screening for elevated cholesterol: {discussed/ordered:14545} HIV testing: {discussed/ordered:14545} Hepatitis C: {discussed/ordered:14545} Hepatitis B: {discussed/ordered:14545} Syphilis if at high risk: {discussed/ordered:14545} GC/CT {GC/CT screening :23818} Osteoporosis screening considered based upon risk of fracture from [facility] calculator. Major osteoporotic fracture risk is ***%. DEXA {ordered not order:23822}.  Reviewed risk factors for latent tuberculosis and {not indicated/requested/declined:14582}   Discussed family history, BRCA testing {not indicated/requested/declined:14582}. Tool used to risk stratify was ***.  Cervical cancer screening: {PAPTYPE:23819} Breast cancer screening: {mammoscreen:23820} Colorectal cancer screening: {crcscreen:23821::"discussed, colonoscopy ordered"} Lung cancer screening: {discussed/declined/written info:19698}. See documentation below regarding indications/risks/benefits.  Vaccinations ***.   Follow up in 1 *** year or sooner if indicated.    Maury Dus, MD [facility]

## 2022-12-23 NOTE — Patient Instructions (Addendum)
It was great to see you!  Things we discussed: -You are due for your Tdap, Pneumonia, and Shingrix vaccines.  You can have these done at your pharmacy.  Once completed, please give a copy of records to our office. -I have ordered your mammogram (screening test for breast cancer) and a bone density test (screening for osteoporosis). Please call [facility] at [phone] to schedule an appointment. They are located at [address] [facility]. -I have also ordered stool-based colon cancer screening. -Please start checking your blood pressure once daily at home.  Keep a written log of the numbers and bring it to your cardiology appointment -We are checking some blood work (cholesterol, A1c, and hepatitis C).  I will send you a MyChart message with the results or call if needed. -Complete the blue advanced directives packet at your convenience. -Return in 6 months for monitoring of your lung nodule (repeat chest CT).  Let us know if any concerns in the meantime.  Take care, Dr Anner Crete

## 2022-12-24 ENCOUNTER — Encounter: Payer: Self-pay | Admitting: Family Medicine

## 2022-12-24 DIAGNOSIS — D751 Secondary polycythemia: Secondary | ICD-10-CM | POA: Insufficient documentation

## 2022-12-24 LAB — LIPID PANEL
Chol/HDL Ratio: 2.3 ratio (ref 0.0–4.4)
Cholesterol, Total: 214 mg/dL — ABNORMAL HIGH (ref 100–199)
HDL: 95 mg/dL (ref >39)
LDL Chol Calc (NIH): 101 mg/dL — ABNORMAL HIGH (ref 0–99)
Triglycerides: 103 mg/dL (ref 0–149)
VLDL Cholesterol Cal: 18 mg/dL (ref 5–40)

## 2022-12-24 LAB — HEMOGLOBIN A1C
Est. average glucose Bld gHb Est-mCnc: 117 mg/dL
Hgb A1c MFr Bld: 5.7 % — ABNORMAL HIGH (ref 4.8–5.6)

## 2022-12-24 LAB — HCV AB W REFLEX TO QUANT PCR: HCV Ab: NONREACTIVE

## 2022-12-24 LAB — HCV INTERPRETATION

## 2022-12-24 NOTE — Assessment & Plan Note (Addendum)
BP mildly elevated to 140/86. Recheck was 160s/120s which I suspect was erroneous measurement. No diagnosis of HTN although she has had multiple elevated readings over past few months. Offered ambulatory 24-hr BP monitoring with Dr Raymondo Band which patient declines. She prefers to monitor BP at home and will discuss with cardiology at her upcoming appt on July 11th.

## 2022-12-27 ENCOUNTER — Telehealth: Payer: Self-pay | Admitting: Family Medicine

## 2022-12-27 NOTE — Telephone Encounter (Signed)
Called patient to discuss lab results from recent visit.  A1c in prediabetes range. This has been stable for many years.  Lipid panel shows slightly elevated total cholesterol and LDL.   The 10-year ASCVD risk score (Arnett DK, et al., 2019) is: 18.4%   Values used to calculate the score:     Age: 72 years     Sex: Female     Is Non-Hispanic African American: No     Diabetic: No     Tobacco smoker: No     Systolic Blood Pressure: 164 mmHg     Is BP treated: Yes     HDL Cholesterol: 95 mg/dL     Total Cholesterol: 214 mg/dL  16-XWRU ASCVD risk of 10.3% if you use prior BP reading (140 systolic), not treated.  I recommended statin therapy but patient declines at this time. She wishes to discuss further with cardiology at her next visit which I think is reasonable.    Maury Dus, MD PGY-3, [facility]

## 2023-01-12 ENCOUNTER — Encounter (HOSPITAL_COMMUNITY): Payer: Self-pay | Admitting: Internal Medicine

## 2023-01-12 ENCOUNTER — Ambulatory Visit (HOSPITAL_COMMUNITY)
Admission: RE | Admit: 2023-01-12 | Discharge: 2023-01-12 | Disposition: A | Discharge status: Home / Self Care | Payer: PPO | Source: Ambulatory Visit | Attending: Internal Medicine | Admitting: Internal Medicine

## 2023-01-12 VITALS — BP 134/82 | HR 82 | Ht 63.0 in | Wt 183.4 lb

## 2023-01-12 DIAGNOSIS — D6869 Other thrombophilia: Secondary | ICD-10-CM

## 2023-01-12 DIAGNOSIS — I4819 Other persistent atrial fibrillation: Secondary | ICD-10-CM | POA: Insufficient documentation

## 2023-01-12 DIAGNOSIS — Z7182 Exercise counseling: Secondary | ICD-10-CM | POA: Insufficient documentation

## 2023-01-12 DIAGNOSIS — Z79899 Other long term (current) drug therapy: Secondary | ICD-10-CM | POA: Insufficient documentation

## 2023-01-12 DIAGNOSIS — Z6832 Body mass index (BMI) 32.0-32.9, adult: Secondary | ICD-10-CM | POA: Diagnosis not present

## 2023-01-12 DIAGNOSIS — I4892 Unspecified atrial flutter: Secondary | ICD-10-CM | POA: Diagnosis not present

## 2023-01-12 DIAGNOSIS — Z7901 Long term (current) use of anticoagulants: Secondary | ICD-10-CM | POA: Insufficient documentation

## 2023-01-12 DIAGNOSIS — I4891 Unspecified atrial fibrillation: Secondary | ICD-10-CM | POA: Diagnosis not present

## 2023-01-12 DIAGNOSIS — E669 Obesity, unspecified: Secondary | ICD-10-CM | POA: Diagnosis not present

## 2023-01-12 LAB — COMPREHENSIVE METABOLIC PANEL
ALT: 30 U/L (ref 0–44)
AST: 24 U/L (ref 15–41)
Albumin: 3.9 g/dL (ref 3.5–5.0)
Alkaline Phosphatase: 65 U/L (ref 38–126)
Anion gap: 9 (ref 5–15)
BUN: 16 mg/dL (ref 8–23)
CO2: 29 mmol/L (ref 22–32)
Calcium: 9.2 mg/dL (ref 8.9–10.3)
Chloride: 103 mmol/L (ref 98–111)
Creatinine, Ser: 1.19 mg/dL — ABNORMAL HIGH (ref 0.44–1.00)
GFR, Estimated: 49 mL/min — ABNORMAL LOW (ref >60)
Glucose, Bld: 110 mg/dL — ABNORMAL HIGH (ref 70–99)
Potassium: 4.6 mmol/L (ref 3.5–5.1)
Sodium: 141 mmol/L (ref 135–145)
Total Bilirubin: 0.8 mg/dL (ref 0.3–1.2)
Total Protein: 6.6 g/dL (ref 6.5–8.1)

## 2023-01-12 LAB — TSH: TSH: 2.356 u[IU]/mL (ref 0.350–4.500)

## 2023-01-12 MED ORDER — AMIODARONE HCL 200 MG PO TABS: ORAL_TABLET | ORAL | 2 refills | Status: DC

## 2023-01-12 MED ORDER — APIXABAN 5 MG PO TABS: 5.0000 mg | ORAL_TABLET | Freq: Two times a day (BID) | ORAL | 5 refills | Status: DC

## 2023-01-12 NOTE — Patient Instructions (Addendum)
Continue amiodarone to 200mg  twice a day for 1 month then reduce to once a day

## 2023-01-12 NOTE — Progress Notes (Signed)
Primary Care Physician: Nelia Shi, MD Primary Cardiologist: None Primary Electrophysiologist: None Referring Physician: [facility]   Jill Marquez is a 72 y.o. female with no past medical history who presents for consultation in the [facility]. The patient was initially diagnosed with atrial fibrillation on 09/26/22 after presenting to [facility] for the second time. She presented to the urgent care on 3/14 for shortness of breath and was diagnosed with CAP. She presented to urgent care again on 3/25 with ongoing shortness of breath and required respiratory support in form of nasal cannula and found to be in Afib with RVR. She was transferred to ED on same day. She also had bilateral lower extremity edema and BNP was 698. CT PE was negative but found incidental pulmonary nodule. She left AMA and started on Eliquis. Established with PCP on 4/5 and started on Lopressor 25 mg BID and lasix 40 mg daily. Echocardiogram has been ordered. PCP f/u 4/12 Lopressor increased to 50 mg BID. Patient is on Eliquis 5 mg BID for a CHADS2VASC score of 2.  On evaluation 10/17/22, she is currently in Afib. She does note that she feels better since PCP increased metoprolol dosage. No worsening shortness of breath, chest pain, or edema. She definitely notes the edema has improved. Scheduled for echo at end of this month.   Denies snoring, stop breathing. She sleeps well and wakes up feeling rested.  On follow up today 11/03/22, she is currently in atrial flutter. She is s/p DCCV on 10/27/22 with successful conversion to NSR. She feels short of breath and tired particularly in the afternoon. She went back into arrhythmia sometime between 4/26 and 4/30 due to echocardiogram on 4/30 noting probable Afib at that time.   She is compliant with anticoagulation and has not missed any doses. She has no bleeding concerns.  On follow up 12/07/22, she is currently in rate controlled  atrial flutter. She is s/p DCCV on 11/29/22 with successful conversion to NSR. She is currently taking amiodarone 200 mg once daily. She could feel she went out of rhythm several days ago. However, she states she feels much better than before while taking amiodarone. She specifically states she can do her daily activities without interruption being on amiodarone. She is not interested in an ablation at this time. No missed doses of Eliquis 5 mg BID.   On follow up 01/12/23, she is currently in rate controlled atrial flutter. She has been on reload of amiodarone 200 mg BID. Patient tells me she feels great overall. She has noted improvement in BP. No missed doses of Eliquis 5 mg BID.   Today, she denies symptoms of palpitations, chest pain, shortness of breath, orthopnea, PND, lower extremity edema, dizziness, presyncope, syncope, snoring, daytime somnolence, bleeding, or neurologic sequela. The patient is tolerating medications without difficulties and is otherwise without complaint today.   Atrial Fibrillation Risk Factors:  she does not have symptoms or diagnosis of sleep apnea. The patient does not have a history of early familial atrial fibrillation or other arrhythmias.  she has a BMI of Body mass index is 32.49 kg/m.Marland Kitchen Filed Weights   01/12/23 1524  Weight: 83.2 kg    Atrial Fibrillation Management history:  Previous antiarrhythmic drugs: amiodarone  Previous cardioversions: 10/27/22 Previous ablations: None Anticoagulation history: Eliquis 5 mg BID    Past Medical History:  Diagnosis Date   Atrial fibrillation (HCC)    Vitamin D deficiency    Past Surgical  History:  Procedure Laterality Date   CARDIOVERSION N/A 10/27/2022   Procedure: CARDIOVERSION;  Surgeon: [name], MD;  [facility]: [facility];  Service: Cardiovascular;  Laterality: N/A;   CARDIOVERSION N/A 11/29/2022   Procedure: CARDIOVERSION;  Surgeon: Parke Poisson, MD;  Location: [facility];   Service: Cardiovascular;  Laterality: N/A;    Current Outpatient Medications  Medication Sig Dispense Refill   albuterol (VENTOLIN HFA) 108 (90 Base) MCG/ACT inhaler Inhale 1-2 puffs into the lungs every 6 (six) hours as needed for wheezing or shortness of breath. 8 g 0   cholecalciferol (VITAMIN D3) 25 MCG (1000 UNIT) tablet Take 1,000 Units by mouth daily.     fluticasone (FLONASE) 50 MCG/ACT nasal spray Place 1 spray into both nostrils daily as needed for allergies or rhinitis.     furosemide (LASIX) 40 MG tablet Take 1 tablet (40 mg total) by mouth daily. 30 tablet 3   ipratropium-albuterol (DUONEB) 0.5-2.5 (3) MG/3ML SOLN Take 3 mLs by nebulization every 4 (four) hours as needed. 90 mL 0   amiodarone (PACERONE) 200 MG tablet Take 1 tablet by mouth twice a day for 1 month then reduce to once a day 60 tablet 2   apixaban (ELIQUIS) 5 MG TABS tablet Take 1 tablet (5 mg total) by mouth 2 (two) times daily. 60 tablet 5   No current facility-administered medications for this encounter.    Allergies  Allergen Reactions   Pork-Derived Products Anaphylaxis    ROS- All systems are reviewed and negative except as per the HPI above.  Physical Exam: Vitals:   01/12/23 1524  BP: 134/82  Pulse: 82  Weight: 83.2 kg  Height: 5\' 3"  (1.6 m)    GEN- The patient is well appearing, alert and oriented x 3 today.   Neck - no JVD or carotid bruit noted Lungs- Clear to ausculation bilaterally, normal work of breathing Heart- Irregular rate and rhythm, no murmurs, rubs or gallops, PMI not laterally displaced Extremities- no clubbing, cyanosis, or edema Skin - no rash or ecchymosis noted   Wt Readings from Last 3 Encounters:  01/12/23 83.2 kg  12/23/22 81.9 kg  12/07/22 83.2 kg    EKG today demonstrates: Vent. rate 82 BPM PR interval * ms QRS duration 98 ms QT/QTcB 410/479 ms P-R-T axes * 259 82 Atrial flutter with variable A-V block Right superior axis deviation Incomplete right bundle  branch block Cannot rule out Anterior infarct , age undetermined Abnormal ECG When compared with ECG of 07-Dec-2022 16:04, PREVIOUS ECG IS PRESENT  Echo 11/01/22:  1. Left ventricular ejection fraction, by estimation, is 50% with beat to  beat variability. The left ventricle has low normal function. The left  ventricle has no regional wall motion abnormalities. There is mild left  ventricular hypertrophy. Left  ventricular diastolic parameters are indeterminate.   2. Right ventricular systolic function is mildly reduced. The right  ventricular size is normal. There is normal pulmonary artery systolic  pressure. The estimated right ventricular systolic pressure is 29.6 mmHg.   3. Left atrial size was moderately dilated.   4. Right atrial size was moderately dilated.   5. The mitral valve is grossly normal. Trivial mitral valve  regurgitation. No evidence of mitral stenosis.   6. The aortic valve is tricuspid. Aortic valve regurgitation is not  visualized. No aortic stenosis is present.   7. The inferior vena cava is normal in size with greater than 50%  respiratory variability, suggesting right  atrial pressure of 3 mmHg.   Epic records are reviewed at length today.  CHA2DS2-VASc Score = 2  The patient's score is based upon: CHF History: 0 HTN History: 0 Diabetes History: 0 Stroke History: 0 Vascular Disease History: 0 Age Score: 1 Gender Score: 1       ASSESSMENT AND PLAN: Persistent Atrial Fibrillation (ICD10:  I48.19) / atrial flutter The patient's CHA2DS2-VASc score is 2, indicating a 2.2% annual risk of stroke.   She is s/p successful DCCV on 10/27/22. (QT/Qtc 444 ms while in SR). S/p DCCV on 11/29/22 with successful conversion to NSR x 3 shocks.  She is in rate controlled atrial flutter today. She would prefer to continue amiodarone 200 mg BID for 1 month - I will draw bloodwork to check Cmet and TSH. She cannot do a repeat DCCV at this point because of scheduling in the  next month. She will call to schedule DCCV and in the meantime continue 200 mg BID x 1 month. Patient is telling me she is considering to remain in rate controlled flutter. She emphasizes how much better she has felt while taking amiodarone. I advised patient due to age and current medication can consider transition to Tikosyn. She seems very hesitant to do this due to hospital stay. She also seems very reluctant to pursue ablation. I have advised I would like her to speak with EP on options other than amiodarone (Tikosyn and/or ablation). I have reiterated the potential for adverse effects of long term amiodarone usage.   Continue Eliquis 5 mg BID.  Okay to have cup of coffee daily. Limit alcoholic beverages 1-2/week okay.   2. Obesity Body mass index is 32.49 kg/m. Lifestyle modification was discussed at length including regular exercise and weight reduction. Encouraged daily walking.  Will schedule with EP to discuss treatment plan / hopefully help determine strategy other than amiodarone.    [place] Bells, PA-C Afib Clinic [facility] [address] [place], [place] [address] [phone] 01/12/2023 4:10 PM

## 2023-01-22 NOTE — Progress Notes (Unsigned)
Subjective:   Jill Marquez is a 72 y.o. female who presents for an Initial Medicare Annual Wellness Visit.  Visit Complete: {[phone]}  Patient Medicare AWV questionnaire was completed by the patient on ***; I have confirmed that all information answered by patient is correct and no changes since this date.  Review of Systems    ***       Objective:    There were no vitals filed for this visit. There is no height or weight on file to calculate BMI.     10/27/2022   11:44 AM 10/14/2022    1:58 PM 10/07/2022    1:30 PM 09/26/2022    4:14 PM  Advanced Directives  Does Patient Have a Medical Advance Directive? No No No No  Would patient like information on creating a medical advance directive?  No - Patient declined  No - Patient declined    Current Medications (verified) Outpatient Encounter Medications as of 01/23/2023  Medication Sig   albuterol (VENTOLIN HFA) 108 (90 Base) MCG/ACT inhaler Inhale 1-2 puffs into the lungs every 6 (six) hours as needed for wheezing or shortness of breath.   amiodarone (PACERONE) 200 MG tablet Take 1 tablet by mouth twice a day for 1 month then reduce to once a day   apixaban (ELIQUIS) 5 MG TABS tablet Take 1 tablet (5 mg total) by mouth 2 (two) times daily.   cholecalciferol (VITAMIN D3) 25 MCG (1000 UNIT) tablet Take 1,000 Units by mouth daily.   fluticasone (FLONASE) 50 MCG/ACT nasal spray Place 1 spray into both nostrils daily as needed for allergies or rhinitis.   furosemide (LASIX) 40 MG tablet Take 1 tablet (40 mg total) by mouth daily.   ipratropium-albuterol (DUONEB) 0.5-2.5 (3) MG/3ML SOLN Take 3 mLs by nebulization every 4 (four) hours as needed.   No facility-administered encounter medications on file as of 01/23/2023.    Allergies (verified) Pork-derived products   History: Past Medical History:  Diagnosis Date   Atrial fibrillation (HCC)    Vitamin D deficiency    Past Surgical History:  Procedure  Laterality Date   CARDIOVERSION N/A 10/27/2022   Procedure: CARDIOVERSION;  Surgeon: [name], MD;  [facility];  Service: Cardiovascular;  Laterality: N/A;   CARDIOVERSION N/A 11/29/2022   Procedure: CARDIOVERSION;  Surgeon: Parke Poisson, MD;  [facility]: [facility];  Service: Cardiovascular;  Laterality: N/A;   Family History  Problem Relation Age of Onset   Hypertension Mother    Thyroid disease Mother    Heart disease Mother    Social History   Socioeconomic History   Marital status: Single    Spouse name: Not on file   Number of children: Not on file   Years of education: Not on file   Highest education level: Some college, no degree  Occupational History   Not on file  Tobacco Use   Smoking status: Former    Types: Cigarettes   Smokeless tobacco: Never  Vaping Use   Vaping status: Never Used  Substance and Sexual Activity   Alcohol use: Yes    Alcohol/week: 1.0 - 2.0 standard drink of alcohol    Types: 1 - 2 Glasses of wine per week    Comment: ocass   Drug use: Never   Sexual activity: Not on file  Other Topics Concern   Not on file  Social History Narrative   Not on file   Social Determinants of Health   Financial  Resource Strain: Low Risk  (10/13/2022)   Overall Financial Resource Strain (CARDIA)    Difficulty of Paying Living Expenses: Not very hard  Food Insecurity: No Food Insecurity (10/13/2022)   Hunger Vital Sign    Worried About Running Out of Food in the Last Year: Never true    Ran Out of Food in the Last Year: Never true  Transportation Needs: No Transportation Needs (10/13/2022)   PRAPARE - Administrator, Civil Service (Medical): No    Lack of Transportation (Non-Medical): No  Physical Activity: Unknown (10/13/2022)   Exercise Vital Sign    Days of Exercise per Week: Patient declined    Minutes of Exercise per Session: Not on file  Stress: Patient Declined (10/13/2022)   [name] of  Occupational Health - Occupational Stress Questionnaire    Feeling of Stress : Patient declined  Social Connections: Unknown (10/13/2022)   Social Connection and Isolation Panel [NHANES]    Frequency of Communication with Friends and Family: More than three times a week    Frequency of Social Gatherings with Friends and Family: More than three times a week    Attends Religious Services: More than 4 times per year    Active Member of [employer] or Organizations: Yes    Attends Engineer, structural: More than 4 times per year    Marital Status: Patient declined    Tobacco Counseling Counseling given: Not Answered   Clinical Intake:              How often do you need to have someone help you when you read instructions, pamphlets, or other written materials from your doctor or pharmacy?: (P) 1 - Never         Activities of Daily Living    01/20/2023    9:38 AM 11/29/2022    8:50 AM  In your present state of health, do you have any difficulty performing the following activities:  Hearing? 0 0  Vision? 0 0  Difficulty concentrating or making decisions? 0 0  Walking or climbing stairs? 0 0  Dressing or bathing? 0 0  Doing errands, shopping? 0   Preparing Food and eating ? N   Using the Toilet? N   In the past six months, have you accidently leaked urine? N   Do you have problems with loss of bowel control? N   Managing your Medications? N   Managing your Finances? N   Housekeeping or managing your Housekeeping? N     Patient Care Team: Nelia Shi, MD as PCP - General (Family Medicine)  Indicate any recent Medical Services you may have received from other than [facility] providers in the past year (date may be approximate).     Assessment:   This is a routine wellness examination for [place].  Hearing/Vision screen No results found.  Dietary issues and exercise activities discussed:     Goals Addressed   None    Depression Screen    12/23/2022     2:37 PM 10/14/2022    1:57 PM 10/07/2022    1:31 PM  PHQ 2/9 Scores  PHQ - 2 Score 0 0 0  PHQ- 9 Score  0 3    Fall Risk    01/20/2023    9:38 AM 12/23/2022    2:37 PM 10/14/2022    1:57 PM  Fall Risk   Falls in the past year? 1 0 0  Number falls in past yr: 0  0  Injury  with Fall? 0 0 0  Risk for fall due to :  No Fall Risks     MEDICARE RISK AT HOME:   TIMED UP AND GO:  Was the test performed? No    Cognitive Function:        Immunizations Immunization History  Administered Date(s) Administered   PFIZER(Purple Top)SARS-COV-2 Vaccination 09/13/2019, 10/07/2019, 04/18/2020    {TDAP status:2101805}  {Pneumococcal vaccine status:2101807}  Covid-19 vaccine status: Information provided on how to obtain vaccines.   Qualifies for Shingles Vaccine? Yes   Zostavax completed No   Shingrix Completed?: No.    Education has been provided regarding the importance of this vaccine. Patient has been advised to call insurance company to determine out of pocket expense if they have not yet received this vaccine. Advised may also receive vaccine at local pharmacy or Health Dept. Verbalized acceptance and understanding.  Screening Tests Health Maintenance  Topic Date Due   Medicare Annual Wellness (AWV)  Never done   DTaP/Tdap/Td (1 - Tdap) Never done   Colonoscopy  Never done   Zoster Vaccines- Shingrix (1 of 2) Never done   Pneumonia Vaccine 35+ Years old (1 of 1 - PCV) Never done   DEXA SCAN  Never done   MAMMOGRAM  07/27/2017   COVID-19 Vaccine (4 - 2023-24 season) 03/04/2022   INFLUENZA VACCINE  02/02/2023   Hepatitis C Screening  Completed   HPV VACCINES  Aged Out    Health Maintenance  Health Maintenance Due  Topic Date Due   Medicare Annual Wellness (AWV)  Never done   DTaP/Tdap/Td (1 - Tdap) Never done   Colonoscopy  Never done   Zoster Vaccines- Shingrix (1 of 2) Never done   Pneumonia Vaccine 48+ Years old (1 of 1 - PCV) Never done   DEXA SCAN  Never done    MAMMOGRAM  07/27/2017   COVID-19 Vaccine (4 - 2023-24 season) 03/04/2022    {Colorectal cancer screening:2101809}  Mammogram status: Ordered 12/23/22. Pt provided with contact info and advised to call to schedule appt.   Bone Density status: Ordered 12/23/22. Pt provided with contact info and advised to call to schedule appt.  Lung Cancer Screening: (Low Dose CT Chest recommended if Age 10-80 years, 20 pack-year currently smoking OR have quit w/in 15years.) does not qualify.   Lung Cancer Screening Referral: n/a  Additional Screening:  Hepatitis C Screening: does qualify; Completed 12/23/22  Vision Screening: Recommended annual ophthalmology exams for early detection of glaucoma and other disorders of the eye. Is the patient up to date with their annual eye exam?  {YES/NO:21197} Who is the provider or what is the name of the office in which the patient attends annual eye exams? *** If pt is not established with a provider, would they like to be referred to a provider to establish care? {YES/NO:21197}.   Dental Screening: Recommended annual dental exams for proper oral hygiene  Community Resource Referral / Chronic Care Management: CRR required this visit?  {YES/NO:21197}  CCM required this visit?  {CCM Required choices:[phone]}     Plan:     I have personally reviewed and noted the following in the patient's chart:   Medical and social history Use of alcohol, tobacco or illicit drugs  Current medications and supplements including opioid prescriptions. {Opioid [phone]} Functional ability and status Nutritional status Physical activity Advanced directives List of other physicians Hospitalizations, surgeries, and ER visits in previous 12 months Vitals Screenings to include cognitive, depression, and falls Referrals and appointments  In addition, I have reviewed and discussed with patient certain preventive protocols, quality metrics, and best practice  recommendations. A written personalized care plan for preventive services as well as general preventive health recommendations were provided to patient.     Kandis Fantasia [place], [place]   1/61/0960   After Visit Summary: {CHL AMB AWV After Visit [phone]}  Nurse Notes: ***

## 2023-01-22 NOTE — Patient Instructions (Incomplete)
Ms. Caffee , Thank you for taking time to come for your Medicare Wellness Visit. I appreciate your ongoing commitment to your health goals. Please review the following plan we discussed and let me know if I can assist you in the future.   These are the goals we discussed:  Goals   None     This is a list of the screening recommended for you and due dates:  Health Maintenance  Topic Date Due   Medicare Annual Wellness Visit  Never done   DTaP/Tdap/Td vaccine (1 - Tdap) Never done   Colon Cancer Screening  Never done   Zoster (Shingles) Vaccine (1 of 2) Never done   Pneumonia Vaccine (1 of 1 - PCV) Never done   DEXA scan (bone density measurement)  Never done   Mammogram  07/27/2017   COVID-19 Vaccine (4 - 2023-24 season) 03/04/2022   Flu Shot  02/02/2023   Hepatitis C Screening  Completed   HPV Vaccine  Aged Out    Advanced directives: Information on Advanced Care Planning can be found at [facility] Advance Health Care Directives Advance Health Care Directives ([email or web address] Please bring a copy of your health care power of attorney and living will to the office to be added to your chart at your convenience.  Conditions/risks identified: Aim for 30 minutes of exercise or brisk walking, 6-8 glasses of water, and 5 servings of fruits and vegetables each day.  Next appointment: Follow up in one year for your annual wellness visit    Preventive Care 65 Years and Older, Female Preventive care refers to lifestyle choices and visits with your health care provider that can promote health and wellness. What does preventive care include? A yearly physical exam. This is also called an annual well check. Dental exams once or twice a year. Routine eye exams. Ask your health care provider how often you should have your eyes checked. Personal lifestyle choices, including: Daily care of your teeth and gums. Regular physical activity. Eating a healthy diet. Avoiding tobacco  and drug use. Limiting alcohol use. Practicing safe sex. Taking low-dose aspirin every day. Taking vitamin and mineral supplements as recommended by your health care provider. What happens during an annual well check? The services and screenings done by your health care provider during your annual well check will depend on your age, overall health, lifestyle risk factors, and family history of disease. Counseling  Your health care provider may ask you questions about your: Alcohol use. Tobacco use. Drug use. Emotional well-being. Home and relationship well-being. Sexual activity. Eating habits. History of falls. Memory and ability to understand (cognition). Work and work Astronomer. Reproductive health. Screening  You may have the following tests or measurements: Height, weight, and BMI. Blood pressure. Lipid and cholesterol levels. These may be checked every 5 years, or more frequently if you are over 71 years old. Skin check. Lung cancer screening. You may have this screening every year starting at age 71 if you have a 30-pack-year history of smoking and currently smoke or have quit within the past 71 years. Fecal occult blood test (FOBT) of the stool. You may have this test every year starting at age 71. Flexible sigmoidoscopy or colonoscopy. You may have a sigmoidoscopy every 5 years or a colonoscopy every 10 years starting at age 71. Hepatitis C blood test. Hepatitis B blood test. Sexually transmitted disease (STD) testing. Diabetes screening. This is done by checking your blood sugar (glucose) after you have not  eaten for a while (fasting). You may have this done every 1-3 years. Bone density scan. This is done to screen for osteoporosis. You may have this done starting at age 71. Mammogram. This may be done every 1-2 years. Talk to your health care provider about how often you should have regular mammograms. Talk with your health care provider about your test results,  treatment options, and if necessary, the need for more tests. Vaccines  Your health care provider may recommend certain vaccines, such as: Influenza vaccine. This is recommended every year. Tetanus, diphtheria, and acellular pertussis (Tdap, Td) vaccine. You may need a Td booster every 10 years. Zoster vaccine. You may need this after age 71. Pneumococcal 13-valent conjugate (PCV13) vaccine. One dose is recommended after age 71. Pneumococcal polysaccharide (PPSV23) vaccine. One dose is recommended after age 71. Talk to your health care provider about which screenings and vaccines you need and how often you need them. This information is not intended to replace advice given to you by your health care provider. Make sure you discuss any questions you have with your health care provider. Document Released: 07/17/2015 Document Revised: 03/09/2016 Document Reviewed: 04/21/2015 Elsevier Interactive Patient Education  2017 [employer].  Fall Prevention in the Home Falls can cause injuries. They can happen to people of all ages. There are many things you can do to make your home safe and to help prevent falls. What can I do on the outside of my home? Regularly fix the edges of walkways and driveways and fix any cracks. Remove anything that might make you trip as you walk through a door, such as a raised step or threshold. Trim any bushes or trees on the path to your home. Use bright outdoor lighting. Clear any walking paths of anything that might make someone trip, such as rocks or tools. Regularly check to see if handrails are loose or broken. Make sure that both sides of any steps have handrails. Any raised decks and porches should have guardrails on the edges. Have any leaves, snow, or ice cleared regularly. Use sand or salt on walking paths during winter. Clean up any spills in your garage right away. This includes oil or grease spills. What can I do in the bathroom? Use night  lights. Install grab bars by the toilet and in the tub and shower. Do not use towel bars as grab bars. Use non-skid mats or decals in the tub or shower. If you need to sit down in the shower, use a plastic, non-slip stool. Keep the floor dry. Clean up any water that spills on the floor as soon as it happens. Remove soap buildup in the tub or shower regularly. Attach bath mats securely with double-sided non-slip rug tape. Do not have throw rugs and other things on the floor that can make you trip. What can I do in the bedroom? Use night lights. Make sure that you have a light by your bed that is easy to reach. Do not use any sheets or blankets that are too big for your bed. They should not hang down onto the floor. Have a firm chair that has side arms. You can use this for support while you get dressed. Do not have throw rugs and other things on the floor that can make you trip. What can I do in the kitchen? Clean up any spills right away. Avoid walking on wet floors. Keep items that you use a lot in easy-to-reach places. If you need to reach  something above you, use a strong step stool that has a grab bar. Keep electrical cords out of the way. Do not use floor polish or wax that makes floors slippery. If you must use wax, use non-skid floor wax. Do not have throw rugs and other things on the floor that can make you trip. What can I do with my stairs? Do not leave any items on the stairs. Make sure that there are handrails on both sides of the stairs and use them. Fix handrails that are broken or loose. Make sure that handrails are as long as the stairways. Check any carpeting to make sure that it is firmly attached to the stairs. Fix any carpet that is loose or worn. Avoid having throw rugs at the top or bottom of the stairs. If you do have throw rugs, attach them to the floor with carpet tape. Make sure that you have a light switch at the top of the stairs and the bottom of the stairs. If  you do not have them, ask someone to add them for you. What else can I do to help prevent falls? Wear shoes that: Do not have high heels. Have rubber bottoms. Are comfortable and fit you well. Are closed at the toe. Do not wear sandals. If you use a stepladder: Make sure that it is fully opened. Do not climb a closed stepladder. Make sure that both sides of the stepladder are locked into place. Ask someone to hold it for you, if possible. Clearly mark and make sure that you can see: Any grab bars or handrails. First and last steps. Where the edge of each step is. Use tools that help you move around (mobility aids) if they are needed. These include: Canes. Walkers. Scooters. Crutches. Turn on the lights when you go into a dark area. Replace any light bulbs as soon as they burn out. Set up your furniture so you have a clear path. Avoid moving your furniture around. If any of your floors are uneven, fix them. If there are any pets around you, be aware of where they are. Review your medicines with your doctor. Some medicines can make you feel dizzy. This can increase your chance of falling. Ask your doctor what other things that you can do to help prevent falls. This information is not intended to replace advice given to you by your health care provider. Make sure you discuss any questions you have with your health care provider. Document Released: 04/16/2009 Document Revised: 11/26/2015 Document Reviewed: 07/25/2014 Elsevier Interactive Patient Education  2017 Elsevier Inc. ASSESSMENT:

## 2023-01-23 ENCOUNTER — Ambulatory Visit (INDEPENDENT_AMBULATORY_CARE_PROVIDER_SITE_OTHER): Payer: PPO

## 2023-01-23 VITALS — Ht 63.0 in | Wt 183.0 lb

## 2023-01-23 DIAGNOSIS — Z0000 Encounter for general adult medical examination without abnormal findings: Secondary | ICD-10-CM

## 2023-04-08 ENCOUNTER — Other Ambulatory Visit (HOSPITAL_COMMUNITY): Payer: Self-pay | Admitting: Internal Medicine

## 2023-04-10 ENCOUNTER — Other Ambulatory Visit (HOSPITAL_COMMUNITY): Payer: Self-pay | Admitting: *Deleted

## 2023-04-10 MED ORDER — AMIODARONE HCL 200 MG PO TABS: 200.0000 mg | ORAL_TABLET | Freq: Every day | ORAL | 1 refills | Status: DC

## 2023-04-12 ENCOUNTER — Encounter: Payer: Self-pay | Admitting: *Deleted

## 2023-04-12 ENCOUNTER — Ambulatory Visit: Payer: PPO | Attending: Cardiology | Admitting: Cardiology

## 2023-04-12 ENCOUNTER — Encounter: Payer: Self-pay | Admitting: Cardiology

## 2023-04-12 VITALS — BP 146/90 | HR 82 | Ht 63.0 in | Wt 186.0 lb

## 2023-04-12 DIAGNOSIS — I4819 Other persistent atrial fibrillation: Secondary | ICD-10-CM

## 2023-04-12 DIAGNOSIS — D6869 Other thrombophilia: Secondary | ICD-10-CM

## 2023-04-12 DIAGNOSIS — I4891 Unspecified atrial fibrillation: Secondary | ICD-10-CM | POA: Diagnosis not present

## 2023-04-12 NOTE — Patient Instructions (Addendum)
Medication Instructions:  Your physician recommends that you continue on your current medications as directed. Please refer to the Current Medication list given to you today.  *If you need a refill on your cardiac medications before your next appointment, please call your pharmacy*   Lab Work: Pre procedure labs -- we will call you to schedule:  BMP & CBC  If you have a lab test that is abnormal and we need to change your treatment, we will call you to review the results -- otherwise no news is good news.    Testing/Procedures: Your physician has recommended that you have a Cardioversion (DCCV). Electrical Cardioversion uses a jolt of electricity to your heart either through paddles or wired patches attached to your chest. This is a controlled, usually prescheduled, procedure. Defibrillation is done under light anesthesia in the hospital, and you usually go home the day of the procedure. This is done to get your heart back into a normal rhythm. You are not awake for the procedure. Please see the instruction sheet given to you today.   Your physician has requested that you have cardiac CT 1 month PRIOR to your ablation. Cardiac computed tomography (CT) is a painless test that uses an x-ray machine to take clear, detailed pictures of your heart.  Please follow instruction below located under "other instructions". We will call you to schedule.  Your physician has recommended that you have an ablation. Catheter ablation is a medical procedure used to treat some cardiac arrhythmias (irregular heartbeats).   Your ablation is scheduled for 08/31/2023. Please arrive at [facility] at 11:30 pm.  We will call you for further instructions.   Follow-Up: At [facility], you and your health needs are our priority.  As part of our continuing mission to provide you with exceptional heart care, we have created designated Provider Care Teams.  These Care Teams include your primary Cardiologist  (physician) and Advanced Practice Providers (APPs -  Physician Assistants and Nurse Practitioners) who all work together to provide you with the care you need, when you need it.  Your next appointment:   1 month(s) after your ablation  The format for your next appointment:   In Person  Provider:   [facility]   Thank you for choosing [facility]!!   Dory Horn, RN [phone]    Other Instructions   Cardiac Ablation Cardiac ablation is a procedure to destroy (ablate) some heart tissue that is sending bad signals. These bad signals cause problems in heart rhythm. The heart has many areas that make these signals. If there are problems in these areas, they can make the heart beat in a way that is not normal. Destroying some tissues can help make the heart rhythm normal. Tell your doctor about: Any allergies you have. All medicines you are taking. These include vitamins, herbs, eye drops, creams, and over-the-counter medicines. Any problems you or family members have had with medicines that make you fall asleep (anesthetics). Any blood disorders you have. Any surgeries you have had. Any medical conditions you have, such as kidney failure. Whether you are pregnant or may be pregnant. What are the risks? This is a safe procedure. But problems may occur, including: Infection. Bruising and bleeding. Bleeding into the chest. Stroke or blood clots. Damage to nearby areas of your body. Allergies to medicines or dyes. The need for a pacemaker if the normal system is damaged. Failure of the procedure to treat the problem. What happens before the procedure?  Medicines Ask your doctor about: Changing or stopping your normal medicines. This is important. Taking aspirin and ibuprofen. Do not take these medicines unless your doctor tells you to take them. Taking other medicines, vitamins, herbs, and supplements. General instructions Follow instructions from your doctor about  what you cannot eat or drink. Plan to have someone take you home from the hospital or clinic. If you will be going home right after the procedure, plan to have someone with you for 24 hours. Ask your doctor what steps will be taken to prevent infection. What happens during the procedure?  An IV tube will be put into one of your veins. You will be given a medicine to help you relax. The skin on your neck or groin will be numbed. A cut (incision) will be made in your neck or groin. A needle will be put through your cut and into a large vein. A tube (catheter) will be put into the needle. The tube will be moved to your heart. Dye may be put through the tube. This helps your doctor see your heart. Small devices (electrodes) on the tube will send out signals. A type of energy will be used to destroy some heart tissue. The tube will be taken out. Pressure will be held on your cut. This helps stop bleeding. A bandage will be put over your cut. The exact procedure may vary among doctors and hospitals. What happens after the procedure? You will be watched until you leave the hospital or clinic. This includes checking your heart rate, breathing rate, oxygen, and blood pressure. Your cut will be watched for bleeding. You will need to lie still for a few hours. Do not drive for 24 hours or as long as your doctor tells you. Summary Cardiac ablation is a procedure to destroy some heart tissue. This is done to treat heart rhythm problems. Tell your doctor about any medical conditions you may have. Tell him or her about all medicines you are taking to treat them. This is a safe procedure. But problems may occur. These include infection, bruising, bleeding, and damage to nearby areas of your body. Follow what your doctor tells you about food and drink. You may also be told to change or stop some of your medicines. After the procedure, do not drive for 24 hours or as long as your doctor tells you. This  information is not intended to replace advice given to you by your health care provider. Make sure you discuss any questions you have with your health care provider. Document Revised: 09/10/2021 Document Reviewed: 05/23/2019 Elsevier Patient Education  2023 Elsevier Inc.   Cardiac Ablation, Care After  This sheet gives you information about how to care for yourself after your procedure. Your health care provider may also give you more specific instructions. If you have problems or questions, contact your health care provider. What can I expect after the procedure? After the procedure, it is common to have: Bruising around your puncture site. Tenderness around your puncture site. Skipped heartbeats. If you had an atrial fibrillation ablation, you may have atrial fibrillation during the first several months after your procedure.  Tiredness (fatigue).  Follow these instructions at home: Puncture site care  Follow instructions from your health care provider about how to take care of your puncture site. Make sure you: If present, leave stitches (sutures), skin glue, or adhesive strips in place. These skin closures may need to stay in place for up to 2 weeks. If adhesive strip  edges start to loosen and curl up, you may trim the loose edges. Do not remove adhesive strips completely unless your health care provider tells you to do that. If a large square bandage is present, this may be removed 24 hours after surgery.  Check your puncture site every day for signs of infection. Check for: Redness, swelling, or pain. Fluid or blood. If your puncture site starts to bleed, lie down on your back, apply firm pressure to the area, and contact your health care provider. Warmth. Pus or a bad smell. A pea or small marble sized lump at the site is normal and can take up to three months to resolve.  Driving Do not drive for at least 4 days after your procedure or however long your health care provider  recommends. (Do not resume driving if you have previously been instructed not to drive for other health reasons.) Do not drive or use heavy machinery while taking prescription pain medicine. Activity Avoid activities that take a lot of effort for at least 7 days after your procedure. Do not lift anything that is heavier than 5 lb (4.5 kg) for one week.  No sexual activity for 1 week.  Return to your normal activities as told by your health care provider. Ask your health care provider what activities are safe for you. General instructions Take over-the-counter and prescription medicines only as told by your health care provider. Do not use any products that contain nicotine or tobacco, such as cigarettes and e-cigarettes. If you need help quitting, ask your health care provider. You may shower after 24 hours, but Do not take baths, swim, or use a hot tub for 1 week.  Do not drink alcohol for 24 hours after your procedure. Keep all follow-up visits as told by your health care provider. This is important. Contact a health care provider if: You have redness, mild swelling, or pain around your puncture site. You have fluid or blood coming from your puncture site that stops after applying firm pressure to the area. Your puncture site feels warm to the touch. You have pus or a bad smell coming from your puncture site. You have a fever. You have chest pain or discomfort that spreads to your neck, jaw, or arm. You have chest pain that is worse with lying on your back or taking a deep breath. You are sweating a lot. You feel nauseous. You have a fast or irregular heartbeat. You have shortness of breath. You are dizzy or light-headed and feel the need to lie down. You have pain or numbness in the arm or leg closest to your puncture site. Get help right away if: Your puncture site suddenly swells. Your puncture site is bleeding and the bleeding does not stop after applying firm pressure to the  area. These symptoms may represent a serious problem that is an emergency. Do not wait to see if the symptoms will go away. Get medical help right away. Call your local emergency services (911 in the [place] Do not drive yourself to the hospital. Summary After the procedure, it is normal to have bruising and tenderness at the puncture site in your groin, neck, or forearm. Check your puncture site every day for signs of infection. Get help right away if your puncture site is bleeding and the bleeding does not stop after applying firm pressure to the area. This is a medical emergency. This information is not intended to replace advice given to you by your health care provider.  Make sure you discuss any questions you have with your health care provider.

## 2023-04-12 NOTE — H&P (View-Only) (Signed)
Electrophysiology Office Note:   Date:  04/12/2023  ID:  Jill Marquez, DOB 12-23-1950, MRN [id number]  Primary Cardiologist: None Electrophysiologist: None      History of Present Illness:   Jill Marquez is a 72 y.o. female with h/o atrial fibrillation seen today for  for Electrophysiology evaluation of atrial fibrillation at the request of Jill Marquez.    She was diagnosed with atrial fibrillation 09/26/2022.  She presented urgent care with shortness of breath and was diagnosed with pneumonia.  She then presented again with ongoing shortness of breath and was found to be in atrial fibrillation.  She had cardioversion, but did go back into atrial fibrillation.  She is also had atrial flutter.  She is currently on amiodarone.  Discussed the use of AI scribe software for clinical note transcription with the patient, who gave verbal consent to proceed.  History of Present Illness   The patient, with a history of atrial fibrillation and atrial flutter, presents with breathing challenges and heart fluttering. The patient reports that these symptoms started around the end of April, after being treated for pneumonia. The patient has undergone two cardioversions, one in May and another in June. The patient reported feeling great when in rhythm, but the effects only lasted for a few days each time. The patient is currently on amiodarone and Eliquis. The patient reports feeling fine when calm and still, but experiences shortness of breath and heart fluttering when active or exerting herself. The patient does not report feeling fatigued, but is frustrated by her inability to be as active as she would like. The patient's blood pressure has been high on the last few checks.      Review of systems complete and found to be negative unless listed in HPI.   EP Information / Studies Reviewed:    EKG is ordered today. Personal review as below.  EKG Interpretation Date/Time:  Wednesday April 12 2023  14:02:04 EDT Ventricular Rate:  82 PR Interval:    QRS Duration:  106 QT Interval:  414 QTC Calculation: 483 R Axis:   -75  Text Interpretation: Atrial flutter Left axis deviation Incomplete right bundle branch block Anterior infarct (cited on or before 07-Dec-2022) When compared with ECG of 12-Jan-2023 15:34, No significant change since last tracing Confirmed by Jill Marquez ([id number]) on 04/12/2023 2:06:09 PM   Risk Assessment/Calculations:    CHA2DS2-VASc Score = 2   This indicates a 2.2% annual risk of stroke. The patient's score is based upon: CHF History: 0 HTN History: 0 Diabetes History: 0 Stroke History: 0 Vascular Disease History: 0 Age Score: 1 Gender Score: 1          Physical Exam:   VS:  BP (!) 146/90   Pulse 82   Ht 5\' 3"  (1.6 m)   Wt 186 lb (84.4 kg)   SpO2 95%   BMI 32.95 kg/m    Wt Readings from Last 3 Encounters:  04/12/23 186 lb (84.4 kg)  01/23/23 183 lb (83 kg)  01/12/23 183 lb 6.4 oz (83.2 kg)     GEN: Well nourished, well developed in no acute distress NECK: No JVD; No carotid bruits CARDIAC: Irregularly irregular rate and rhythm, no murmurs, rubs, gallops RESPIRATORY:  Clear to auscultation without rales, wheezing or rhonchi  ABDOMEN: Soft, non-tender, non-distended EXTREMITIES:  No edema; No deformity   ASSESSMENT AND PLAN:    1.  Persistent atrial fibrillation/atrial flutter: Currently on amiodarone.  She feels short of breath and atrial fibrillation.  She would prefer a rhythm control strategy.  [name] plan for cardioversion.  In the long-term, she would like to be able to stop amiodarone.  Due to that, we [name] plan for ablation.  Risk and benefits have been discussed.  She understands the risks and is agreed to the procedure.  Risk, benefits, and alternatives to EP study and radiofrequency/pulse field ablation for afib were also discussed in detail today. These risks include but are not limited to stroke, bleeding, vascular damage, tamponade,  perforation, damage to the esophagus, lungs, and other structures, pulmonary vein stenosis, worsening renal function, and death. The patient understands these risk and wishes to proceed.  We [name] therefore proceed with catheter ablation at the next available time.  Carto, ICE, anesthesia are requested for the procedure.  [name] also obtain CT PV protocol prior to the procedure to exclude LAA thrombus and further evaluate atrial anatomy.  2.  Obesity: Lifestyle modification encouraged  3.  Secondary hypercoagulable state: Only on Eliquis for atrial fibrillation  4.  Elevated blood pressure: Blood pressures have been significantly elevated over the last few checks.  She Antino Mayabb check for the next 2 weeks at home and call us with results.  Follow up with [facility] in 4 weeks  Signed, [name], MD

## 2023-04-12 NOTE — Progress Notes (Signed)
Electrophysiology Office Note:   Date:  04/12/2023  ID:  Mahaila Tischer, DOB 12-23-1950, MRN [id number]  Primary Cardiologist: None Electrophysiologist: None      History of Present Illness:   Tayia Stonesifer is a 72 y.o. female with h/o atrial fibrillation seen today for  for Electrophysiology evaluation of atrial fibrillation at the request of [name].    She was diagnosed with atrial fibrillation 09/26/2022.  She presented urgent care with shortness of breath and was diagnosed with pneumonia.  She then presented again with ongoing shortness of breath and was found to be in atrial fibrillation.  She had cardioversion, but did go back into atrial fibrillation.  She is also had atrial flutter.  She is currently on amiodarone.  Discussed the use of AI scribe software for clinical note transcription with the patient, who gave verbal consent to proceed.  History of Present Illness   The patient, with a history of atrial fibrillation and atrial flutter, presents with breathing challenges and heart fluttering. The patient reports that these symptoms started around the end of April, after being treated for pneumonia. The patient has undergone two cardioversions, one in May and another in June. The patient reported feeling great when in rhythm, but the effects only lasted for a few days each time. The patient is currently on amiodarone and Eliquis. The patient reports feeling fine when calm and still, but experiences shortness of breath and heart fluttering when active or exerting herself. The patient does not report feeling fatigued, but is frustrated by her inability to be as active as she would like. The patient's blood pressure has been high on the last few checks.      Review of systems complete and found to be negative unless listed in HPI.   EP Information / Studies Reviewed:    EKG is ordered today. Personal review as below.  EKG Interpretation Date/Time:  Wednesday April 12 2023  14:02:04 EDT Ventricular Rate:  82 PR Interval:    QRS Duration:  106 QT Interval:  414 QTC Calculation: 483 R Axis:   -75  Text Interpretation: Atrial flutter Left axis deviation Incomplete right bundle branch block Anterior infarct (cited on or before 07-Dec-2022) When compared with ECG of 12-Jan-2023 15:34, No significant change since last tracing Confirmed by Sabre Romberger ([id number]) on 04/12/2023 2:06:09 PM   Risk Assessment/Calculations:    CHA2DS2-VASc Score = 2   This indicates a 2.2% annual risk of stroke. The patient's score is based upon: CHF History: 0 HTN History: 0 Diabetes History: 0 Stroke History: 0 Vascular Disease History: 0 Age Score: 1 Gender Score: 1          Physical Exam:   VS:  BP (!) 146/90   Pulse 82   Ht 5\' 3"  (1.6 m)   Wt 186 lb (84.4 kg)   SpO2 95%   BMI 32.95 kg/m    Wt Readings from Last 3 Encounters:  04/12/23 186 lb (84.4 kg)  01/23/23 183 lb (83 kg)  01/12/23 183 lb 6.4 oz (83.2 kg)     GEN: Well nourished, well developed in no acute distress NECK: No JVD; No carotid bruits CARDIAC: Irregularly irregular rate and rhythm, no murmurs, rubs, gallops RESPIRATORY:  Clear to auscultation without rales, wheezing or rhonchi  ABDOMEN: Soft, non-tender, non-distended EXTREMITIES:  No edema; No deformity   ASSESSMENT AND PLAN:    1.  Persistent atrial fibrillation/atrial flutter: Currently on amiodarone.  She feels short of breath and atrial fibrillation.  She would prefer a rhythm control strategy.  [name] plan for cardioversion.  In the long-term, she would like to be able to stop amiodarone.  Due to that, we [name] plan for ablation.  Risk and benefits have been discussed.  She understands the risks and is agreed to the procedure.  Risk, benefits, and alternatives to EP study and radiofrequency/pulse field ablation for afib were also discussed in detail today. These risks include but are not limited to stroke, bleeding, vascular damage, tamponade,  perforation, damage to the esophagus, lungs, and other structures, pulmonary vein stenosis, worsening renal function, and death. The patient understands these risk and wishes to proceed.  We [name] therefore proceed with catheter ablation at the next available time.  Carto, ICE, anesthesia are requested for the procedure.  [name] also obtain CT PV protocol prior to the procedure to exclude LAA thrombus and further evaluate atrial anatomy.  2.  Obesity: Lifestyle modification encouraged  3.  Secondary hypercoagulable state: Only on Eliquis for atrial fibrillation  4.  Elevated blood pressure: Blood pressures have been significantly elevated over the last few checks.  She Antino Mayabb check for the next 2 weeks at home and call us with results.  Follow up with [facility] in 4 weeks  Signed, [name], MD

## 2023-04-24 ENCOUNTER — Other Ambulatory Visit: Payer: Self-pay

## 2023-04-24 MED ORDER — ALBUTEROL SULFATE HFA 108 (90 BASE) MCG/ACT IN AERS: 1.0000 | INHALATION_SPRAY | Freq: Four times a day (QID) | RESPIRATORY_TRACT | 0 refills | Status: DC | PRN

## 2023-04-28 NOTE — OR Nursing (Signed)
Called patient with pre-procedure instructions for tomorrow.   Patient informed of:   Time to arrive for procedure. 0900 Remain NPO past midnight.  Must have a ride home and a responsible adult to remain with them for 24 ours post procedure.  Confirmed blood thinner. Confirmed no breaks in taking blood thinner for 3+ weeks prior to procedure. Confirmed patient stopped all GLP-1s and GLP-2s for at least one week before procedure.   Spoke with patient regarding above information.

## 2023-05-01 ENCOUNTER — Ambulatory Visit (HOSPITAL_COMMUNITY): Payer: PPO | Admitting: Anesthesiology

## 2023-05-01 ENCOUNTER — Encounter (HOSPITAL_COMMUNITY)
Admission: RE | Disposition: A | Discharge status: Home / Self Care | Payer: Self-pay | Source: Home / Self Care | Attending: Internal Medicine

## 2023-05-01 ENCOUNTER — Other Ambulatory Visit: Payer: Self-pay

## 2023-05-01 ENCOUNTER — Ambulatory Visit (HOSPITAL_COMMUNITY)
Admission: RE | Admit: 2023-05-01 | Discharge: 2023-05-01 | Disposition: A | Discharge status: Home / Self Care | Payer: PPO | Attending: Internal Medicine | Admitting: Internal Medicine

## 2023-05-01 ENCOUNTER — Ambulatory Visit (HOSPITAL_COMMUNITY): Payer: Self-pay | Admitting: Anesthesiology

## 2023-05-01 DIAGNOSIS — R030 Elevated blood-pressure reading, without diagnosis of hypertension: Secondary | ICD-10-CM | POA: Diagnosis not present

## 2023-05-01 DIAGNOSIS — Z6832 Body mass index (BMI) 32.0-32.9, adult: Secondary | ICD-10-CM | POA: Insufficient documentation

## 2023-05-01 DIAGNOSIS — I4892 Unspecified atrial flutter: Secondary | ICD-10-CM | POA: Insufficient documentation

## 2023-05-01 DIAGNOSIS — E669 Obesity, unspecified: Secondary | ICD-10-CM | POA: Insufficient documentation

## 2023-05-01 DIAGNOSIS — I480 Paroxysmal atrial fibrillation: Secondary | ICD-10-CM

## 2023-05-01 DIAGNOSIS — I4819 Other persistent atrial fibrillation: Secondary | ICD-10-CM | POA: Insufficient documentation

## 2023-05-01 DIAGNOSIS — I4891 Unspecified atrial fibrillation: Secondary | ICD-10-CM | POA: Diagnosis not present

## 2023-05-01 DIAGNOSIS — Z7901 Long term (current) use of anticoagulants: Secondary | ICD-10-CM | POA: Diagnosis not present

## 2023-05-01 DIAGNOSIS — Z79899 Other long term (current) drug therapy: Secondary | ICD-10-CM | POA: Insufficient documentation

## 2023-05-01 DIAGNOSIS — D6869 Other thrombophilia: Secondary | ICD-10-CM | POA: Insufficient documentation

## 2023-05-01 HISTORY — PX: CARDIOVERSION: SHX1299

## 2023-05-01 LAB — POCT I-STAT, CHEM 8
BUN: 14 mg/dL (ref 8–23)
Calcium, Ion: 1.27 mmol/L (ref 1.15–1.40)
Chloride: 102 mmol/L (ref 98–111)
Creatinine, Ser: 1.1 mg/dL — ABNORMAL HIGH (ref 0.44–1.00)
Glucose, Bld: 108 mg/dL — ABNORMAL HIGH (ref 70–99)
HCT: 51 % — ABNORMAL HIGH (ref 36.0–46.0)
Hemoglobin: 17.3 g/dL — ABNORMAL HIGH (ref 12.0–15.0)
Potassium: 3.9 mmol/L (ref 3.5–5.1)
Sodium: 142 mmol/L (ref 135–145)
TCO2: 26 mmol/L (ref 22–32)

## 2023-05-01 SURGERY — CARDIOVERSION
Anesthesia: General

## 2023-05-01 MED ORDER — LIDOCAINE 2% (20 MG/ML) 5 ML SYRINGE
INTRAMUSCULAR | Status: DC | PRN
  Administered 2023-05-01: 50 mg via INTRAVENOUS

## 2023-05-01 MED ORDER — PROPOFOL 10 MG/ML IV BOLUS
INTRAVENOUS | Status: DC | PRN
  Administered 2023-05-01: 50 mg via INTRAVENOUS
  Administered 2023-05-01: 30 mg via INTRAVENOUS

## 2023-05-01 MED ORDER — SODIUM CHLORIDE 0.9 % IV SOLN: INTRAVENOUS | Status: DC

## 2023-05-01 SURGICAL SUPPLY — 1 items: PAD DEFIB RADIO PHYSIO CONN (PAD) ×2 IMPLANT

## 2023-05-01 NOTE — Interval H&P Note (Signed)
History and Physical Interval Note:  05/01/2023 10:26 AM  Jill Marquez  has presented today for surgery, with the diagnosis of AFIB.  The various methods of treatment have been discussed with the patient and family. After consideration of risks, benefits and other options for treatment, the patient has consented to  Procedure(s): CARDIOVERSION (N/A) as a surgical intervention.  The patient's history has been reviewed, patient examined, no change in status, stable for surgery.  I have reviewed the patient's chart and labs.  Questions were answered to the patient's satisfaction.     Chrystie Nose

## 2023-05-01 NOTE — Anesthesia Postprocedure Evaluation (Signed)
Anesthesia Post Note  Patient: Jill Marquez  Procedure(s) Performed: CARDIOVERSION     Patient location during evaluation: PACU Anesthesia Type: General Level of consciousness: awake and alert and oriented Pain management: pain level controlled Vital Signs Assessment: post-procedure vital signs reviewed and stable Respiratory status: spontaneous breathing, nonlabored ventilation and respiratory function stable Cardiovascular status: blood pressure returned to baseline and stable Postop Assessment: no apparent nausea or vomiting Anesthetic complications: no   No notable events documented.  Last Vitals:  Vitals:   05/01/23 0857 05/01/23 1120  BP: (!) 160/121   Pulse: 93   Resp: (!) 26   Temp: 36.6 C 36.5 C  SpO2: 92%     Last Pain:  Vitals:   05/01/23 1120  TempSrc: Temporal  PainSc: 0-No pain                 Dierdra Salameh A.

## 2023-05-01 NOTE — Anesthesia Preprocedure Evaluation (Addendum)
Anesthesia Evaluation  Patient identified by MRN, date of birth, ID band Patient awake    Reviewed: Allergy & Precautions, NPO status , Patient's Chart, lab work & pertinent test results, reviewed documented beta blocker date and time   Airway Mallampati: II  TM Distance: >3 FB Neck ROM: Full    Dental  (+) Teeth Intact, Dental Advisory Given, Missing,    Pulmonary asthma , former smoker   Pulmonary exam normal breath sounds clear to auscultation       Cardiovascular hypertension, Pt. on medications and Pt. on home beta blockers + dysrhythmias Atrial Fibrillation  Rhythm:Irregular Rate:Abnormal     Neuro/Psych negative neurological ROS     GI/Hepatic negative GI ROS, Neg liver ROS,,,  Endo/Other  negative endocrine ROS    Renal/GU negative Renal ROS     Musculoskeletal negative musculoskeletal ROS (+)    Abdominal  (+) + obese  Peds  Hematology  (+) Blood dyscrasia (Eliqus)   Anesthesia Other Findings   Reproductive/Obstetrics                              Anesthesia Physical Anesthesia Plan  ASA: 3  Anesthesia Plan: General   Post-op Pain Management: Minimal or no pain anticipated   Induction: Intravenous  PONV Risk Score and Plan: 3 and TIVA and Treatment may vary due to age or medical condition  Airway Management Planned: Mask  Additional Equipment:   Intra-op Plan:   Post-operative Plan:   Informed Consent: I have reviewed the patients History and Physical, chart, labs and discussed the procedure including the risks, benefits and alternatives for the proposed anesthesia with the patient or authorized representative who has indicated his/her understanding and acceptance.     Dental advisory given  Plan Discussed with: CRNA  Anesthesia Plan Comments:         Anesthesia Quick Evaluation

## 2023-05-01 NOTE — CV Procedure (Signed)
    CARDIOVERSION NOTE  Procedure: Electrical Cardioversion Indications:  Atrial Fibrillation  Procedure Details:  Consent: Risks of procedure as well as the alternatives and risks of each were explained to the (patient/caregiver).  Consent for procedure obtained.  Time Out: Verified patient identification, verified procedure, site/side was marked, verified correct patient position, special equipment/implants available, medications/allergies/relevent history reviewed, required imaging and test results available.  Performed  Patient placed on cardiac monitor, pulse oximetry, supplemental oxygen as necessary.  Sedation given:  propofol per anesthesia Pacer pads placed anterior and posterior chest.  Cardioverted  4  time(s).  Cardioverted at 200J, 300J, 360J x 2 with pressure and pad repositioning.  Impression: Findings: Post procedure EKG shows: Atrial Fibrillation Complications: None Patient did tolerate procedure well.  Plan: Unsuccessful DCCV despite multiple shocks, high voltage and direct pressure and pad repositioning. There was no evidence of any sinus beats after cardioversion. Plan to proceed with afib ablation per Dr. Elberta Fortis.  Time Spent Directly with the Patient:  45 minutes   Chrystie Nose, MD, [facility], FACP  [place]  [facility]  Medical Director of the [facility]  [facility] Diplomate of the American Board of Clinical Lipidology Attending Cardiologist  Direct Dial: [phone]  Fax: [phone]  Website:  [email or web address] Nicely Desiderio Dolata 05/01/2023, 11:12 AM

## 2023-05-01 NOTE — Discharge Instructions (Signed)

## 2023-05-01 NOTE — Transfer of Care (Signed)
Immediate Anesthesia Transfer of Care Note  Patient: Jill Marquez  Procedure(s) Performed: CARDIOVERSION  Patient Location: PACU and Cath Lab  Anesthesia Type:General  Level of Consciousness: awake  Airway & Oxygen Therapy: Patient Spontanous Breathing  Post-op Assessment: Report given to RN  Post vital signs: stable  Last Vitals:  Vitals Value Taken Time  BP    Temp    Pulse    Resp    SpO2      Last Pain:  Vitals:   05/01/23 0857  TempSrc: Temporal         Complications: No notable events documented.

## 2023-05-02 ENCOUNTER — Encounter (HOSPITAL_COMMUNITY): Payer: Self-pay | Admitting: Internal Medicine

## 2023-05-11 ENCOUNTER — Telehealth: Payer: Self-pay | Admitting: Cardiology

## 2023-05-11 NOTE — Telephone Encounter (Signed)
  The patient said she was reviewing her AVS with her daughter and noticed some incorrect information that they would like to correct. She would like to get a callback from RN Sherri to discuss

## 2023-05-15 NOTE — Telephone Encounter (Signed)
When reviewing her AVS she noticed that her ablation description (the nurse instruction area) said radiofrequency ablation and that is not the type she is having.  Says she is having the new ablation, the pulsed field ablation. Educated that the information is basic ablation information - but informed I would get with the correct staff so that we may update/correct our ablation dot phrase. Patient verbalized understanding and agreeable to plan.

## 2023-05-16 ENCOUNTER — Other Ambulatory Visit: Payer: Self-pay | Admitting: Family Medicine

## 2023-05-17 ENCOUNTER — Ambulatory Visit
Admission: RE | Admit: 2023-05-17 | Discharge: 2023-05-17 | Disposition: A | Discharge status: Home / Self Care | Payer: PPO | Source: Ambulatory Visit | Attending: Family Medicine | Admitting: Family Medicine

## 2023-05-17 DIAGNOSIS — Z1231 Encounter for screening mammogram for malignant neoplasm of breast: Secondary | ICD-10-CM

## 2023-05-17 DIAGNOSIS — Z0000 Encounter for general adult medical examination without abnormal findings: Secondary | ICD-10-CM

## 2023-06-05 ENCOUNTER — Ambulatory Visit (HOSPITAL_COMMUNITY): Payer: PPO | Admitting: Internal Medicine

## 2023-06-05 ENCOUNTER — Ambulatory Visit (HOSPITAL_COMMUNITY)
Admission: RE | Admit: 2023-06-05 | Discharge: 2023-06-05 | Disposition: A | Discharge status: Home / Self Care | Payer: PPO | Source: Ambulatory Visit | Attending: Internal Medicine | Admitting: Internal Medicine

## 2023-06-05 VITALS — BP 160/114 | HR 91 | Ht 63.0 in | Wt 193.0 lb

## 2023-06-05 DIAGNOSIS — I4819 Other persistent atrial fibrillation: Secondary | ICD-10-CM | POA: Diagnosis present

## 2023-06-05 DIAGNOSIS — I4892 Unspecified atrial flutter: Secondary | ICD-10-CM | POA: Insufficient documentation

## 2023-06-05 DIAGNOSIS — E669 Obesity, unspecified: Secondary | ICD-10-CM | POA: Diagnosis not present

## 2023-06-05 DIAGNOSIS — Z6834 Body mass index (BMI) 34.0-34.9, adult: Secondary | ICD-10-CM | POA: Diagnosis not present

## 2023-06-05 DIAGNOSIS — Z7901 Long term (current) use of anticoagulants: Secondary | ICD-10-CM | POA: Insufficient documentation

## 2023-06-05 DIAGNOSIS — I10 Essential (primary) hypertension: Secondary | ICD-10-CM | POA: Insufficient documentation

## 2023-06-05 DIAGNOSIS — Z7182 Exercise counseling: Secondary | ICD-10-CM | POA: Insufficient documentation

## 2023-06-05 DIAGNOSIS — Z79899 Other long term (current) drug therapy: Secondary | ICD-10-CM | POA: Diagnosis not present

## 2023-06-05 DIAGNOSIS — I4891 Unspecified atrial fibrillation: Secondary | ICD-10-CM

## 2023-06-05 MED ORDER — APIXABAN 5 MG PO TABS: 5.0000 mg | ORAL_TABLET | Freq: Two times a day (BID) | ORAL | 5 refills | Status: DC

## 2023-06-05 MED ORDER — AMIODARONE HCL 200 MG PO TABS: 200.0000 mg | ORAL_TABLET | Freq: Every day | ORAL | 2 refills | Status: DC

## 2023-06-05 NOTE — Addendum Note (Signed)
Encounter addended by: Shona Simpson, RN on: 06/05/2023 3:47 PM  Actions taken: Order list changed

## 2023-06-05 NOTE — Progress Notes (Signed)
Primary Care Physician: Nelia Shi, MD Primary Cardiologist: None Primary Electrophysiologist: None Referring Physician: [facility]   Jill Marquez is a 72 y.o. female with no past medical history who presents for consultation in the [facility]. The patient was initially diagnosed with atrial fibrillation on 09/26/22 after presenting to [facility] for the second time. She presented to the urgent care on 3/14 for shortness of breath and was diagnosed with CAP. She presented to urgent care again on 3/25 with ongoing shortness of breath and required respiratory support in form of nasal cannula and found to be in Afib with RVR. She was transferred to ED on same day. She also had bilateral lower extremity edema and BNP was 698. CT PE was negative but found incidental pulmonary nodule. She left AMA and started on Eliquis. Established with PCP on 4/5 and started on Lopressor 25 mg BID and lasix 40 mg daily. Echocardiogram has been ordered. PCP f/u 4/12 Lopressor increased to 50 mg BID. Patient is on Eliquis 5 mg BID for a CHADS2VASC score of 2.  On evaluation 10/17/22, she is currently in Afib. She does note that she feels better since PCP increased metoprolol dosage. No worsening shortness of breath, chest pain, or edema. She definitely notes the edema has improved. Scheduled for echo at end of this month.   Denies snoring, stop breathing. She sleeps well and wakes up feeling rested.  On follow up today 11/03/22, she is currently in atrial flutter. She is s/p DCCV on 10/27/22 with successful conversion to NSR. She feels short of breath and tired particularly in the afternoon. She went back into arrhythmia sometime between 4/26 and 4/30 due to echocardiogram on 4/30 noting probable Afib at that time.   She is compliant with anticoagulation and has not missed any doses. She has no bleeding concerns.  On follow up 12/07/22, she is currently in rate controlled  atrial flutter. She is s/p DCCV on 11/29/22 with successful conversion to NSR. She is currently taking amiodarone 200 mg once daily. She could feel she went out of rhythm several days ago. However, she states she feels much better than before while taking amiodarone. She specifically states she can do her daily activities without interruption being on amiodarone. She is not interested in an ablation at this time. No missed doses of Eliquis 5 mg BID.   On follow up 01/12/23, she is currently in rate controlled atrial flutter. She has been on reload of amiodarone 200 mg BID. Patient tells me she feels great overall. She has noted improvement in BP. No missed doses of Eliquis 5 mg BID.   On follow up 06/05/23, she is currently in atrial flutter. S/p unsuccessful DCCV on 10/28 x 4 shocks (2 being with 360J). She feels SOB and tired when out of rhythm. She is currently taking amiodarone 200 mg daily. She does not wish to start BP medicine but does admit her systolic reading is in the 160s at home. No missed doses of Eliquis.  Today, she denies symptoms of palpitations, chest pain, shortness of breath, orthopnea, PND, lower extremity edema, dizziness, presyncope, syncope, snoring, daytime somnolence, bleeding, or neurologic sequela. The patient is tolerating medications without difficulties and is otherwise without complaint today.   Atrial Fibrillation Risk Factors:  she does not have symptoms or diagnosis of sleep apnea. The patient does not have a history of early familial atrial fibrillation or other arrhythmias.  she has a BMI of Body  mass index is 34.19 kg/m.Marland Kitchen Filed Weights   06/05/23 1403  Weight: 87.5 kg    Atrial Fibrillation Management history:  Previous antiarrhythmic drugs: amiodarone  Previous cardioversions: 10/27/22, 11/29/22, 05/01/23 Previous ablations: None Anticoagulation history: Eliquis 5 mg BID    Past Medical History:  Diagnosis Date   Allergy    Atrial fibrillation (HCC)     Vitamin D deficiency    Past Surgical History:  Procedure Laterality Date   CARDIOVERSION N/A 10/27/2022   Procedure: CARDIOVERSION;  Surgeon: [name], MD;  Location: [facility] INVASIVE CV LAB;  Service: Cardiovascular;  Laterality: N/A;   CARDIOVERSION N/A 11/29/2022   Procedure: CARDIOVERSION;  Surgeon: Parke Poisson, MD;  Location: [facility] CV [facility];  Service: Cardiovascular;  Laterality: N/A;   CARDIOVERSION N/A 05/01/2023   Procedure: CARDIOVERSION;  Surgeon: Chrystie Nose, MD;  Location: [facility]VAS[facility] CV LAB;  Service: Cardiovascular;  Laterality: N/A;    Current Outpatient Medications  Medication Sig Dispense Refill   albuterol (VENTOLIN HFA) 108 (90 Base) MCG/ACT inhaler Inhale 1-2 puffs into the lungs every 6 (six) hours as needed for wheezing or shortness of breath. Recommend follow up with PCP after CT scan in November, especially given frequent refills.  Please call [facility] and schedule appointment. 18 g 2   amiodarone (PACERONE) 200 MG tablet Take 1 tablet (200 mg total) by mouth daily. 90 tablet 1   apixaban (ELIQUIS) 5 MG TABS tablet Take 1 tablet (5 mg total) by mouth 2 (two) times daily. 60 tablet 5   cholecalciferol (VITAMIN D3) 25 MCG (1000 UNIT) tablet Take 1,000 Units by mouth daily.     furosemide (LASIX) 40 MG tablet Take 1 tablet (40 mg total) by mouth daily. (Patient taking differently: Take 40 mg by mouth daily as needed for fluid or edema.) 30 tablet 3   ibuprofen (ADVIL) 200 MG tablet Take 200 mg by mouth as needed for moderate pain (pain score 4-6).     ipratropium-albuterol (DUONEB) 0.5-2.5 (3) MG/3ML SOLN Take 3 mLs by nebulization every 4 (four) hours as needed. 90 mL 0   No current facility-administered medications for this encounter.    Allergies  Allergen Reactions   Pork-Derived Products Anaphylaxis    ROS- All systems are reviewed and negative except as per the HPI above.  Physical Exam: Vitals:   06/05/23 1403  BP: (!) 160/114   Pulse: 91  Weight: 87.5 kg  Height: 5\' 3"  (1.6 m)    GEN- The patient is well appearing, alert and oriented x 3 today.   Neck - no JVD or carotid bruit noted Lungs- Clear to ausculation bilaterally, normal work of breathing Heart- Irregular rate and rhythm, no murmurs, rubs or gallops, PMI not laterally displaced Extremities- no clubbing, cyanosis, or edema Skin - no rash or ecchymosis noted   Wt Readings from Last 3 Encounters:  06/05/23 87.5 kg  05/01/23 80.9 kg  04/12/23 84.4 kg    EKG today demonstrates: Vent. rate 91 BPM PR interval * ms QRS duration 102 ms QT/QTcB 394/484 ms P-R-T axes * -82 84 Atrial flutter with variable A-V block Left axis deviation Possible Anterior infarct , age undetermined Abnormal ECG When compared with ECG of 12-Apr-2023 14:02, PREVIOUS ECG IS PRESENT  Echo 11/01/22:  1. Left ventricular ejection fraction, by estimation, is 50% with beat to  beat variability. The left ventricle has low normal function. The left  ventricle has no regional wall motion abnormalities. There is mild left  ventricular  hypertrophy. Left  ventricular diastolic parameters are indeterminate.   2. Right ventricular systolic function is mildly reduced. The right  ventricular size is normal. There is normal pulmonary artery systolic  pressure. The estimated right ventricular systolic pressure is 29.6 mmHg.   3. Left atrial size was moderately dilated.   4. Right atrial size was moderately dilated.   5. The mitral valve is grossly normal. Trivial mitral valve  regurgitation. No evidence of mitral stenosis.   6. The aortic valve is tricuspid. Aortic valve regurgitation is not  visualized. No aortic stenosis is present.   7. The inferior vena cava is normal in size with greater than 50%  respiratory variability, suggesting right atrial pressure of 3 mmHg.   Epic records are reviewed at length today.  CHA2DS2-VASc Score = 2  The patient's score is based upon: CHF  History: 0 HTN History: 0 Diabetes History: 0 Stroke History: 0 Vascular Disease History: 0 Age Score: 1 Gender Score: 1      ASSESSMENT AND PLAN: Persistent Atrial Fibrillation (ICD10:  I48.19) / atrial flutter The patient's CHA2DS2-VASc score is 2, indicating a 2.2% annual risk of stroke.   She is s/p successful DCCV on 10/27/22. (QT/Qtc 444 ms while in SR). S/p DCCV on 11/29/22 with successful conversion to NSR x 3 shocks. S/p unsuccessful DCCV on 05/01/23 (4 shocks with 2 being 360 J)  She is in rate controlled atrial flutter today. Continue amiodarone 200 mg daily.   Continue Eliquis 5 mg BID.  She is scheduled for ablation on 08/31/23.   We will revisit HTN if she remains elevated after ablation. She does not wish to begin medication today.   2. Obesity Body mass index is 34.19 kg/m. Lifestyle modification was discussed at length including regular exercise and weight reduction. Encouraged daily walking.   Follow up as directed for imaging prior to ablation.    Lake Bells, PA-C Afib Clinic [facility] [address] [place], [place] [address] [phone] 06/05/2023 3:39 PM

## 2023-06-06 ENCOUNTER — Telehealth: Payer: Self-pay | Admitting: Cardiology

## 2023-06-06 NOTE — Telephone Encounter (Signed)
Ablation held for 08/31/23. Forwarding to EP procedure scheduler

## 2023-06-06 NOTE — Telephone Encounter (Signed)
Patient is calling for details in regards to her upcoming procedure. Please advise.

## 2023-06-22 NOTE — Telephone Encounter (Signed)
LM for pt to call back - procedure will need to be moved due to a schedule change.

## 2023-06-23 ENCOUNTER — Telehealth: Payer: Self-pay

## 2023-06-23 DIAGNOSIS — I480 Paroxysmal atrial fibrillation: Secondary | ICD-10-CM

## 2023-06-23 NOTE — Telephone Encounter (Signed)
Pt's Afib Ablation has been moved from 2/27 to 3/13 with Dr. Elberta Fortis at 12:30 PM.  She will go to [facility] to have labs done. CT and lab orders placed.   Instruction letters will be sent via MyChart once completed.

## 2023-07-06 NOTE — Telephone Encounter (Signed)
 Pt's Afib Ablation has been moved from 2/27 to 3/13 with Dr. Elberta Fortis at 12:30 PM.  She will go to [facility] to have labs done. CT and lab orders placed.

## 2023-07-10 ENCOUNTER — Telehealth: Payer: Self-pay | Admitting: Cardiology

## 2023-07-10 NOTE — Telephone Encounter (Signed)
 Pt would like a c/b regarding procedure date. Please advise

## 2023-07-10 NOTE — Telephone Encounter (Signed)
 Returned call to patient who states she is so sorry and has figured out her question. She saw several appts on her mychart and thought her ablation might be scheduled wrong, but states she figured out her CT is on 2/17 and ablation still on for 3/13. She appreciates the return call.

## 2023-07-22 ENCOUNTER — Other Ambulatory Visit: Payer: Self-pay | Admitting: Family Medicine

## 2023-08-15 ENCOUNTER — Telehealth: Payer: Self-pay

## 2023-08-15 NOTE — Telephone Encounter (Signed)
Spoke with pt and went over CT/Ablation instructions. She is aware of date/time and where to go for both procedures.  She will go to [facility] to get updated labs but per pt it will be after her CT Scan.   Instruction letters have been sent via MyChart.

## 2023-08-17 ENCOUNTER — Telehealth (HOSPITAL_COMMUNITY): Payer: Self-pay

## 2023-08-17 NOTE — Telephone Encounter (Signed)
Spoke with patient to complete one month pre-procedure call.     New medical conditions?  NO Recent hospitalizations or surgeries? NO Started any new medications? NO Patient made aware to contact office to inform of any new medications started. Any changes in activities of daily living? NO  Pre-procedure testing scheduled: CT on 08/21/23 and lab work ordered. Confirmed patient is taking ELIQUIS and will continue taking medication before procedure or it may need to be rescheduled.  Confirmed patient is scheduled for Atrial Fibrillation Ablation on Thursday, March 13 with Dr. Loman Brooklyn. Instructed patient to arrive at the Main Entrance A at [facility]: [address] [place], [place] [address] and check in at Admitting at 10:30 AM  Sometimes we have an availability that comes up earlier than your scheduled procedure date, are you be willing to come in earlier if needed? NO  Advised of plan to go home the same day and will only stay overnight if medically necessary. You MUST have a responsible adult to drive you home and MUST be with you the first 24 hours after you arrive home or your procedure could be cancelled.  Patient verbalized understanding to information provided and is agreeable to proceed with procedure.

## 2023-08-21 ENCOUNTER — Ambulatory Visit (HOSPITAL_COMMUNITY)
Admission: RE | Admit: 2023-08-21 | Discharge: 2023-08-21 | Disposition: A | Discharge status: Home / Self Care | Payer: PPO | Source: Ambulatory Visit | Attending: Cardiology | Admitting: Cardiology

## 2023-08-21 DIAGNOSIS — I480 Paroxysmal atrial fibrillation: Secondary | ICD-10-CM | POA: Diagnosis present

## 2023-08-21 MED ORDER — IOHEXOL 350 MG/ML SOLN
95.0000 mL | Freq: Once | INTRAVENOUS | Status: AC | PRN
  Administered 2023-08-21: 95 mL via INTRAVENOUS

## 2023-08-22 ENCOUNTER — Telehealth: Payer: Self-pay | Admitting: *Deleted

## 2023-08-22 DIAGNOSIS — R9389 Abnormal findings on diagnostic imaging of other specified body structures: Secondary | ICD-10-CM

## 2023-08-22 DIAGNOSIS — R931 Abnormal findings on diagnostic imaging of heart and coronary circulation: Secondary | ICD-10-CM

## 2023-08-22 NOTE — Telephone Encounter (Signed)
-----   Message from [facility] sent at 08/21/2023  4:39 PM EST ----- CT without LAA thrombus. Needs fasting lipids and establishment with general cardiology for elevated coronary calcium score

## 2023-08-22 NOTE — Telephone Encounter (Signed)
Informed patient of results and verbal understanding expressed. She is aware office will call to arrange an appt to see a general cardiologist to discuss CT findings and if anything needed further.  She would like to get the fasting lipid blood work and this appt sometime in April.  Advised to make sure she gets the fasting blood work before she sees the general cardiologist. Patient verbalized understanding and agreeable to plan.

## 2023-08-30 ENCOUNTER — Telehealth: Payer: Self-pay

## 2023-08-30 NOTE — Telephone Encounter (Signed)
 Pt returned my call.  Discussed procedure time, aware that if we move her to another date, that there is no guarantee she will stay as a 12:30 pm case on the new date.  Aware hospital changes may still occur w/ new date.  She understands but says that she really needs a 12:30 case d/t her dtr driving her (dtr works 3rd shift).   She stated her dtr is taking off 4-5 days to be with her afterwards. Recommended that maybe dtr could take off the night before incase scheduling change occurs again.  Also explained to pt that her dtr did not need to be with her but for 24 hours post ablation -- she understands but dtr would like to stay w/ her longer  (aware we would not give dtr FMLA for that length of time, if needed).  Dr. Elberta Fortis -- CT was done on 2/17, ok for ablation on 4/10 w/o another CT? Also, pt last seen by you 10/9 and afib clinic on 12/2 -- does she need another appt prior to April ablation?  (She does not want to come back in if at all possible).

## 2023-08-30 NOTE — Telephone Encounter (Signed)
 Jill Marquez spoke to pt and her procedure has been moved out to 4/10 at 12:30 pm.   I will update her Instruction letter and send via MyChart.

## 2023-08-30 NOTE — Telephone Encounter (Signed)
 Left message to call back

## 2023-09-12 ENCOUNTER — Encounter: Payer: Self-pay | Admitting: Family Medicine

## 2023-10-02 DIAGNOSIS — R931 Abnormal findings on diagnostic imaging of heart and coronary circulation: Secondary | ICD-10-CM | POA: Diagnosis not present

## 2023-10-03 ENCOUNTER — Other Ambulatory Visit: Payer: Self-pay | Admitting: Family Medicine

## 2023-10-03 ENCOUNTER — Other Ambulatory Visit (HOSPITAL_COMMUNITY): Payer: Self-pay | Admitting: Internal Medicine

## 2023-10-03 LAB — LIPID PANEL
Chol/HDL Ratio: 1.9 ratio (ref 0.0–4.4)
Cholesterol, Total: 215 mg/dL — ABNORMAL HIGH (ref 100–199)
HDL: 115 mg/dL (ref >39)
LDL Chol Calc (NIH): 85 mg/dL (ref 0–99)
Triglycerides: 86 mg/dL (ref 0–149)
VLDL Cholesterol Cal: 15 mg/dL (ref 5–40)

## 2023-10-05 ENCOUNTER — Telehealth (HOSPITAL_COMMUNITY): Payer: Self-pay

## 2023-10-05 NOTE — Telephone Encounter (Signed)
 Attempted to reach patient to discuss upcoming procedure, no answer. Left VM for patient to return call.

## 2023-10-06 NOTE — Telephone Encounter (Signed)
 Call placed to patient to discuss upcoming procedure.   CT: completed.  Labs: ordered. Pt thought her labs were drawn on 10/02/23, but only a Lipid profile was collected and states she was non-fasting for this, so unsure of the accuracy of results.  Any recent signs of acute illness or been started on antibiotics? No Any new medications started? No Any medications to hold? No Any missed doses of blood thinner?  No Advised patient to continue taking ANTICOAGULANT: Eliquis (Apixaban) without missing any doses.  Medication instructions:  On the morning of your procedure DO NOT take any medication., including Eliquis or the procedure may be rescheduled. Nothing to eat or drink after midnight prior to your procedure.  Confirmed patient is scheduled for Atrial Fibrillation Ablation on Thursday, April 10 with Dr. Loman Brooklyn. Instructed patient to arrive at the Main Entrance A at [facility]: [address] [place], [place] [address] and check in at Admitting at 10:30 AM  Advised of plan to go home the same day and will only stay overnight if medically necessary. You MUST have a responsible adult to drive you home and MUST be with you the first 24 hours after you arrive home or your procedure could be cancelled.  Patient verbalized understanding to all instructions provided and agreed to proceed with procedure.

## 2023-10-09 ENCOUNTER — Other Ambulatory Visit: Payer: Self-pay | Admitting: *Deleted

## 2023-10-09 DIAGNOSIS — Z01812 Encounter for preprocedural laboratory examination: Secondary | ICD-10-CM

## 2023-10-09 DIAGNOSIS — I4819 Other persistent atrial fibrillation: Secondary | ICD-10-CM

## 2023-10-10 LAB — CBC
Hematocrit: 51.4 % — ABNORMAL HIGH (ref 34.0–46.6)
Hemoglobin: 17 g/dL — ABNORMAL HIGH (ref 11.1–15.9)
MCH: 33 pg (ref 26.6–33.0)
MCHC: 33.1 g/dL (ref 31.5–35.7)
MCV: 100 fL — ABNORMAL HIGH (ref 79–97)
Platelets: 200 10*3/uL (ref 150–450)
RBC: 5.15 x10E6/uL (ref 3.77–5.28)
RDW: 12.7 % (ref 11.7–15.4)
WBC: 5.7 10*3/uL (ref 3.4–10.8)

## 2023-10-10 LAB — BASIC METABOLIC PANEL WITH GFR
BUN/Creatinine Ratio: 14 (ref 12–28)
BUN: 13 mg/dL (ref 8–27)
CO2: 23 mmol/L (ref 20–29)
Calcium: 9.7 mg/dL (ref 8.7–10.3)
Chloride: 102 mmol/L (ref 96–106)
Creatinine, Ser: 0.95 mg/dL (ref 0.57–1.00)
Glucose: 91 mg/dL (ref 70–99)
Potassium: 4.4 mmol/L (ref 3.5–5.2)
Sodium: 143 mmol/L (ref 134–144)
eGFR: 64 mL/min/{1.73_m2} (ref >59)

## 2023-10-11 NOTE — Anesthesia Preprocedure Evaluation (Signed)
 Anesthesia Evaluation  Patient identified by MRN, date of birth, ID band Patient awake    Reviewed: Allergy & Precautions, NPO status , Patient's Chart, lab work & pertinent test results  Airway        Dental   Pulmonary former smoker nodule          Cardiovascular + dysrhythmias Atrial Fibrillation      Neuro/Psych    GI/Hepatic   Endo/Other    Renal/GU      Musculoskeletal   Abdominal   Peds  Hematology  (+) Blood dyscrasia (polycythemia) Lab Results      Component                Value               Date                      WBC                      5.7                 10/09/2023                HGB                      17.0 (H)            10/09/2023                HCT                      51.4 (H)            10/09/2023                MCV                      100 (H)             10/09/2023                PLT                      200                 10/09/2023              Anesthesia Other Findings Last Eliquis:  Reproductive/Obstetrics                             Anesthesia Physical Anesthesia Plan  ASA: 2  Anesthesia Plan: General   Post-op Pain Management: Tylenol PO (pre-op)*   Induction: Intravenous  PONV Risk Score and Plan: 3 and Ondansetron, Dexamethasone and Treatment may vary due to age or medical condition  Airway Management Planned: Oral ETT  Additional Equipment:   Intra-op Plan:   Post-operative Plan: Extubation in OR  Informed Consent:      Dental advisory given  Plan Discussed with: CRNA and Anesthesiologist  Anesthesia Plan Comments: (Risks of general anesthesia discussed including, but not limited to, sore throat, hoarse voice, chipped/damaged teeth, injury to vocal cords, nausea and vomiting, allergic reactions, lung infection, heart attack, stroke, and death. All questions answered. )       Anesthesia Quick Evaluation

## 2023-10-11 NOTE — Pre-Procedure Instructions (Signed)
 Instructed patient on the following items: Arrival time 1030 Nothing to eat or drink after midnight No meds AM of procedure Responsible person to drive you home and stay with you for 24 hrs  Have you missed any doses of anti-coagulant Eliquis-takes twice a day, hasn't missed any doses.  Don't take dose morning of procedure.

## 2023-10-12 ENCOUNTER — Ambulatory Visit (HOSPITAL_COMMUNITY): Payer: Self-pay | Admitting: Anesthesiology

## 2023-10-12 ENCOUNTER — Ambulatory Visit (HOSPITAL_COMMUNITY)
Admission: RE | Disposition: A | Discharge status: Home / Self Care | Payer: Self-pay | Source: Home / Self Care | Attending: Cardiology

## 2023-10-12 ENCOUNTER — Other Ambulatory Visit: Payer: Self-pay

## 2023-10-12 ENCOUNTER — Ambulatory Visit (HOSPITAL_BASED_OUTPATIENT_CLINIC_OR_DEPARTMENT_OTHER): Payer: Self-pay | Admitting: Anesthesiology

## 2023-10-12 ENCOUNTER — Ambulatory Visit (HOSPITAL_COMMUNITY)
Admission: RE | Admit: 2023-10-12 | Discharge: 2023-10-12 | Disposition: A | Discharge status: Home / Self Care | Payer: PPO | Attending: Cardiology | Admitting: Cardiology

## 2023-10-12 DIAGNOSIS — Z87891 Personal history of nicotine dependence: Secondary | ICD-10-CM | POA: Insufficient documentation

## 2023-10-12 DIAGNOSIS — I4819 Other persistent atrial fibrillation: Secondary | ICD-10-CM | POA: Diagnosis not present

## 2023-10-12 DIAGNOSIS — I4891 Unspecified atrial fibrillation: Secondary | ICD-10-CM

## 2023-10-12 DIAGNOSIS — I4892 Unspecified atrial flutter: Secondary | ICD-10-CM | POA: Insufficient documentation

## 2023-10-12 DIAGNOSIS — Z7901 Long term (current) use of anticoagulants: Secondary | ICD-10-CM | POA: Insufficient documentation

## 2023-10-12 HISTORY — PX: ATRIAL FIBRILLATION ABLATION: EP1191

## 2023-10-12 SURGERY — ATRIAL FIBRILLATION ABLATION
Anesthesia: General

## 2023-10-12 MED ORDER — LACTATED RINGERS IV SOLN: INTRAVENOUS | Status: DC | PRN

## 2023-10-12 MED ORDER — ONDANSETRON HCL 4 MG/2ML IJ SOLN
INTRAMUSCULAR | Status: DC | PRN
  Administered 2023-10-12: 4 mg via INTRAVENOUS

## 2023-10-12 MED ORDER — DEXAMETHASONE SODIUM PHOSPHATE 10 MG/ML IJ SOLN
INTRAMUSCULAR | Status: DC | PRN
  Administered 2023-10-12: 10 mg via INTRAVENOUS

## 2023-10-12 MED ORDER — ATROPINE SULFATE 1 MG/ML IV SOLN
INTRAVENOUS | Status: DC | PRN
Start: 2023-10-12 — End: 2023-10-12
  Administered 2023-10-12: 1 mg via INTRAVENOUS

## 2023-10-12 MED ORDER — SODIUM CHLORIDE 0.9 % IV SOLN
Freq: Once | INTRAVENOUS | Status: AC
  Filled 2023-10-12: qty 10

## 2023-10-12 MED ORDER — LIDOCAINE 2% (20 MG/ML) 5 ML SYRINGE
INTRAMUSCULAR | Status: DC | PRN
  Administered 2023-10-12: 80 mg via INTRAVENOUS

## 2023-10-12 MED ORDER — PHENYLEPHRINE 80 MCG/ML (10ML) SYRINGE FOR IV PUSH (FOR BLOOD PRESSURE SUPPORT)
PREFILLED_SYRINGE | INTRAVENOUS | Status: DC | PRN
  Administered 2023-10-12: 160 ug via INTRAVENOUS
  Administered 2023-10-12 (×2): 80 ug via INTRAVENOUS

## 2023-10-12 MED ORDER — FENTANYL CITRATE (PF) 100 MCG/2ML IJ SOLN
INTRAMUSCULAR | Status: AC
  Filled 2023-10-12: qty 2

## 2023-10-12 MED ORDER — FENTANYL CITRATE (PF) 250 MCG/5ML IJ SOLN
INTRAMUSCULAR | Status: DC | PRN
Start: 2023-10-12 — End: 2023-10-12
  Administered 2023-10-12 (×2): 50 ug via INTRAVENOUS

## 2023-10-12 MED ORDER — SODIUM CHLORIDE 0.9 % IV SOLN
1.7500 mg/kg/h | INTRAVENOUS | Status: DC
  Administered 2023-10-12: 1.75 mg/kg/h via INTRAVENOUS
  Filled 2023-10-12 (×2): qty 250

## 2023-10-12 MED ORDER — ROCURONIUM BROMIDE 10 MG/ML (PF) SYRINGE
PREFILLED_SYRINGE | INTRAVENOUS | Status: DC | PRN
  Administered 2023-10-12: 60 mg via INTRAVENOUS

## 2023-10-12 MED ORDER — PROPOFOL 10 MG/ML IV BOLUS
INTRAVENOUS | Status: DC | PRN
  Administered 2023-10-12: 100 mg via INTRAVENOUS

## 2023-10-12 MED ORDER — PHENYLEPHRINE HCL-NACL 20-0.9 MG/250ML-% IV SOLN
INTRAVENOUS | Status: DC | PRN
  Administered 2023-10-12: 50 ug/min via INTRAVENOUS

## 2023-10-12 MED ORDER — ACETAMINOPHEN 500 MG PO TABS
1000.0000 mg | ORAL_TABLET | Freq: Once | ORAL | Status: AC
  Administered 2023-10-12: 1000 mg via ORAL
  Filled 2023-10-12: qty 2

## 2023-10-12 MED ORDER — EPHEDRINE SULFATE-NACL 50-0.9 MG/10ML-% IV SOSY
PREFILLED_SYRINGE | INTRAVENOUS | Status: DC | PRN
  Administered 2023-10-12: 5 mg via INTRAVENOUS

## 2023-10-12 MED ORDER — SUGAMMADEX SODIUM 200 MG/2ML IV SOLN
INTRAVENOUS | Status: DC | PRN
  Administered 2023-10-12: 200 mg via INTRAVENOUS

## 2023-10-12 MED ORDER — BIVALIRUDIN BOLUS VIA INFUSION
0.1000 mg/kg | Freq: Once | INTRAVENOUS | Status: AC
  Administered 2023-10-12: 62.3 mg via INTRAVENOUS
  Filled 2023-10-12: qty 9

## 2023-10-12 MED ORDER — ACETAMINOPHEN 325 MG PO TABS: 650.0000 mg | ORAL_TABLET | ORAL | Status: DC | PRN

## 2023-10-12 MED ORDER — ONDANSETRON HCL 4 MG/2ML IJ SOLN: 4.0000 mg | Freq: Four times a day (QID) | INTRAMUSCULAR | Status: DC | PRN

## 2023-10-12 MED ORDER — SODIUM CHLORIDE 0.9 % IV SOLN: INTRAVENOUS | Status: DC

## 2023-10-12 SURGICAL SUPPLY — 21 items
BAG SNAP BAND KOVER 36X36 (MISCELLANEOUS) IMPLANT
CABLE PFA RX CATH CONN (CABLE) IMPLANT
CATH 8FR REPROCESSED SOUNDSTAR (CATHETERS) ×1 IMPLANT
CATH 8FR SOUNDSTAR REPROCESSED (CATHETERS) IMPLANT
CATH FARAWAVE ABLATION 31 (CATHETERS) IMPLANT
CATH OCTARAY 2.0 F 3-3-3-3-3 (CATHETERS) IMPLANT
CATH WEBSTER BI DIR CS D-F CRV (CATHETERS) IMPLANT
CLOSURE MYNX CONTROL 6F/7F (Vascular Products) IMPLANT
CLOSURE PERCLOSE PROSTYLE (VASCULAR PRODUCTS) IMPLANT
COVER SWIFTLINK CONNECTOR (BAG) ×2 IMPLANT
DILATOR VESSEL 38 20CM 16FR (INTRODUCER) IMPLANT
GUIDEWIRE INQWIRE 1.5J.035X260 (WIRE) IMPLANT
INQWIRE 1.5J .035X260CM (WIRE) ×1 IMPLANT
KIT VERSACROSS CNCT FARADRIVE (KITS) IMPLANT
PACK EP LF (CUSTOM PROCEDURE TRAY) ×2 IMPLANT
PAD DEFIB RADIO PHYSIO CONN (PAD) ×2 IMPLANT
PATCH CARTO3 (PAD) IMPLANT
SHEATH FARADRIVE STEERABLE (SHEATH) IMPLANT
SHEATH PINNACLE 8F 10CM (SHEATH) IMPLANT
SHEATH PINNACLE 9F 10CM (SHEATH) IMPLANT
SHEATH PROBE COVER 6X72 (BAG) IMPLANT

## 2023-10-12 NOTE — Discharge Instructions (Signed)

## 2023-10-12 NOTE — Anesthesia Postprocedure Evaluation (Signed)
 Anesthesia Post Note  Patient: Jill Marquez  Procedure(s) Performed: ATRIAL FIBRILLATION ABLATION     Patient location during evaluation: PACU Anesthesia Type: General Level of consciousness: awake and alert Pain management: pain level controlled Vital Signs Assessment: post-procedure vital signs reviewed and stable Respiratory status: spontaneous breathing, nonlabored ventilation, respiratory function stable and patient connected to nasal cannula oxygen Cardiovascular status: blood pressure returned to baseline and stable Postop Assessment: no apparent nausea or vomiting Anesthetic complications: no   There were no known notable events for this encounter.  Last Vitals:  Vitals:   10/12/23 1630 10/12/23 1700  BP: 130/88 (!) 138/93  Pulse: 76 66  Resp: (!) 22 (!) 25  Temp:    SpO2: 90% 91%    Last Pain:  Vitals:   10/12/23 1500  TempSrc: Axillary  PainSc: 0-No pain                 [place]

## 2023-10-12 NOTE — Progress Notes (Signed)
 Spoke with Dr. Chaney Malling, informed of pt O2 sats between 90-91%, improved from pt's initial time in cath lab holding of 87-88%, see flowsheets for VS and O2 devices and amounts, pt asymptomatic at this time, Incentive spirometer with instruction given to pt, safety maintained, SS RN, Lillia Abed aware

## 2023-10-12 NOTE — Anesthesia Procedure Notes (Signed)
 Procedure Name: Intubation Date/Time: 10/12/2023 1:02 PM  Performed by: Venia Carbon, CRNAPre-anesthesia Checklist: Patient identified, Emergency Drugs available, Patient being monitored, Suction available and Timeout performed Patient Re-evaluated:Patient Re-evaluated prior to induction Oxygen Delivery Method: Circle system utilized Preoxygenation: Pre-oxygenation with 100% oxygen Induction Type: IV induction Ventilation: Mask ventilation without difficulty Laryngoscope Size: Mac and 3 Grade View: Grade I Tube type: Oral Tube size: 7.0 mm Number of attempts: 1 Airway Equipment and Method: Patient positioned with wedge pillow and Stylet Placement Confirmation: ETT inserted through vocal cords under direct vision, positive ETCO2, CO2 detector and breath sounds checked- equal and bilateral Secured at: 22 cm Tube secured with: Tape

## 2023-10-12 NOTE — H&P (Signed)
  Electrophysiology Office Note:   Date:  10/12/2023  ID:  Jill Marquez, DOB 29-Feb-1951, MRN [id number]  Primary Cardiologist: None Electrophysiologist: None      History of Present Illness:   Jill Marquez is a 73 y.o. female with h/o atrial fibrillation seen today for  for Electrophysiology evaluation of atrial fibrillation at the request of [name].    Today, denies symptoms of palpitations, chest pain, shortness of breath, orthopnea, PND, lower extremity edema, claudication, dizziness, presyncope, syncope, bleeding, or neurologic sequela. The patient is tolerating medications without difficulties. Plan AF ablation today.   EP Information / Studies Reviewed:    EKG is ordered today. Personal review as below.      Risk Assessment/Calculations:    CHA2DS2-VASc Score = 2   This indicates a 2.2% annual risk of stroke. The patient's score is based upon: CHF History: 0 HTN History: 0 Diabetes History: 0 Stroke History: 0 Vascular Disease History: 0 Age Score: 1 Gender Score: 1          Physical Exam:   VS:  BP (!) 168/110   Pulse 86   Temp (!) 97.5 F (36.4 C) (Oral)   Resp 17   Ht 5\' 3"  (1.6 m)   Wt 83 kg   SpO2 95%   BMI 32.42 kg/m    Wt Readings from Last 3 Encounters:  10/12/23 83 kg  06/05/23 87.5 kg  05/01/23 80.9 kg    GEN: No acute distress.   Neck: No JVD Cardiac: RRR, no murmurs, rubs, or gallops.  Respiratory: decreased BS bases bilaterally. GI: Soft, nontender, non-distended  MS: No edema; No deformity. Neuro:  Nonfocal  Skin: warm and dry Psych: Normal affect    ASSESSMENT AND PLAN:    1.  Persistent atrial fibrillation/atrial flutter: [name] has presented today for surgery, with the diagnosis of AF.  The various methods of treatment have been discussed with the patient and family. After consideration of risks, benefits and other options for treatment, the patient has consented to  Procedure(s): Catheter ablation as a  surgical intervention .  Risks include but not limited to complete heart block, stroke, esophageal damage, nerve damage, bleeding, vascular damage, tamponade, perforation, MI, and death. The patient's history has been reviewed, patient examined, no change in status, stable for surgery.  I have reviewed the patient's chart and labs.  Questions were answered to the patient's satisfaction.    Selig W[place]ole Elberta Fortis, MD 10/12/2023 11:04 AM

## 2023-10-12 NOTE — Transfer of Care (Signed)
 Immediate Anesthesia Transfer of Care Note  Patient: Sophi Calligan  Procedure(s) Performed: ATRIAL FIBRILLATION ABLATION  Patient Location: Cath Lab  Anesthesia Type:General  Level of Consciousness: awake, alert , oriented, and patient cooperative  Airway & Oxygen Therapy: Patient Spontanous Breathing and Patient connected to face mask oxygen  Post-op Assessment: Report given to RN, Post -op Vital signs reviewed and stable, Patient moving all extremities, Patient moving all extremities X 4, and Patient able to stick tongue midline  Post vital signs: Reviewed and stable  Last Vitals:  Vitals Value Taken Time  BP    Temp    Pulse 65 10/12/23 1430  Resp 17 10/12/23 1430  SpO2 97 % 10/12/23 1430  Vitals shown include unfiled device data.  Last Pain:  Vitals:   10/12/23 1107  TempSrc:   PainSc: 0-No pain         Complications: There were no known notable events for this encounter.

## 2023-10-13 ENCOUNTER — Encounter (HOSPITAL_COMMUNITY): Payer: Self-pay | Admitting: Cardiology

## 2023-10-13 ENCOUNTER — Telehealth (HOSPITAL_COMMUNITY): Payer: Self-pay

## 2023-10-13 MED FILL — Fentanyl Citrate Preservative Free (PF) Inj 100 MCG/2ML: INTRAMUSCULAR | Qty: 2 | Status: AC

## 2023-10-13 NOTE — Telephone Encounter (Signed)
 Spoke with patient to complete post procedure follow up call.  Patient reports no complications with groin sites.   Instructions reviewed with patient:  Remove large bandage at puncture site after 24 hours. It is normal to have bruising, tenderness and a pea or marble sized lump/knot at the groin site which can take up to three months to resolve.  Get help right away if you notice sudden swelling at the puncture site.  Check your puncture site every day for signs of infection: fever, redness, swelling, pus drainage, warmth, foul odor or excessive pain. If this occurs, please call the office at [phone], to speak with the nurse. Get help right away if your puncture site is bleeding and the bleeding does not stop after applying firm pressure to the area.  You may continue to have skipped beats/ atrial fibrillation during the first several months after your procedure.  It is very important not to miss any doses of your blood thinner Eliquis. Patient restarted taking this medication on yesterday, 10/12/23.   You will follow up with the Afib clinic on 11/09/23 and follow up with the APP on 01/10/24.   Patient verbalized understanding to all instructions provided.

## 2023-11-09 ENCOUNTER — Ambulatory Visit (HOSPITAL_COMMUNITY)
Admission: RE | Admit: 2023-11-09 | Discharge: 2023-11-09 | Disposition: A | Discharge status: Home / Self Care | Source: Ambulatory Visit | Attending: Internal Medicine | Admitting: Internal Medicine

## 2023-11-09 ENCOUNTER — Encounter (HOSPITAL_COMMUNITY): Payer: Self-pay | Admitting: Internal Medicine

## 2023-11-09 VITALS — BP 136/86 | HR 63 | Ht 63.0 in | Wt 197.2 lb

## 2023-11-09 DIAGNOSIS — Z5181 Encounter for therapeutic drug level monitoring: Secondary | ICD-10-CM

## 2023-11-09 DIAGNOSIS — I4819 Other persistent atrial fibrillation: Secondary | ICD-10-CM | POA: Diagnosis not present

## 2023-11-09 DIAGNOSIS — Z79899 Other long term (current) drug therapy: Secondary | ICD-10-CM

## 2023-11-09 DIAGNOSIS — D6869 Other thrombophilia: Secondary | ICD-10-CM | POA: Diagnosis not present

## 2023-11-09 MED ORDER — APIXABAN 5 MG PO TABS: 5.0000 mg | ORAL_TABLET | Freq: Two times a day (BID) | ORAL | 6 refills | Status: DC

## 2023-11-09 NOTE — Progress Notes (Signed)
 Primary Care Physician: Homer Lust, MD Primary Cardiologist: None Primary Electrophysiologist: Dr. Lawana Pray Referring Physician: [facility]   Jill Marquez is a 73 y.o. female with no past medical history who presents for consultation in the [facility]. The patient was initially diagnosed with atrial fibrillation on 09/26/22 after presenting to [facility] for the second time. She presented to the urgent care on 3/14 for shortness of breath and was diagnosed with CAP. She presented to urgent care again on 3/25 with ongoing shortness of breath and required respiratory support in form of nasal cannula and found to be in Afib with RVR. She was transferred to ED on same day. She also had bilateral lower extremity edema and BNP was 698. CT PE was negative but found incidental pulmonary nodule. She left AMA and started on Eliquis . Established with PCP on 4/5 and started on Lopressor  25 mg BID and lasix  40 mg daily. Echocardiogram has been ordered. PCP f/u 4/12 Lopressor  increased to 50 mg BID. Patient is on Eliquis  5 mg BID for a CHADS2VASC score of 2.  On follow up 11/09/23, patient is currently in NSR. S/p Afib ablation on 10/12/23 by Dr. Lawana Pray. She possibly had 2 brief episodes of Afib in the past 4 weeks likely attributed to overexertion on those days. She is doing well overall. She is taking amiodarone  200 mg daily. No chest pain or SOB. Leg sites healed without issue. She is increasing her walking and plans to lose ~20 lbs this year. She is highly motivated to do so. No missed doses of Eliquis .  Today, she denies symptoms of orthopnea, PND, lower extremity edema, dizziness, presyncope, syncope, snoring, daytime somnolence, bleeding, or neurologic sequela. The patient is tolerating medications without difficulties and is otherwise without complaint today.    Atrial Fibrillation Risk Factors:  she does not have symptoms or diagnosis of sleep apnea. The  patient does not have a history of early familial atrial fibrillation or other arrhythmias.  she has a BMI of Body mass index is 34.93 kg/m.Aaron Aas Filed Weights   11/09/23 1115  Weight: 89.4 kg     Atrial Fibrillation Management history:  Previous antiarrhythmic drugs: amiodarone   Previous cardioversions: 10/27/22, 11/29/22, 05/01/23 Previous ablations: 10/12/23 Anticoagulation history: Eliquis  5 mg BID    Past Medical History:  Diagnosis Date   Allergy    Atrial fibrillation (HCC)    Vitamin D deficiency    Past Surgical History:  Procedure Laterality Date   ATRIAL FIBRILLATION ABLATION N/A 10/12/2023   Procedure: ATRIAL FIBRILLATION ABLATION;  Surgeon: Lei Pump, MD;  Location: [facility] INVASIVE CV LAB;  Service: Cardiovascular;  Laterality: N/A;   CARDIOVERSION N/A 10/27/2022   Procedure: CARDIOVERSION;  Surgeon: Bridgette Campus, MD;  Location: [facility] INVAS[facility] CV LAB;  Service: Cardiovascular;  Laterality: N/A;   CARDIOVERSION N/A 11/29/2022   Procedure: CARDIOVERSION;  Surgeon: Euell Herrlich, MD;  Location: [facility] INVASIVE CV LAB;  Service: Cardiovascular;  Laterality: N/A;   CARDIOVERSION N/A 05/01/2023   Procedure: CARDIOVERSION;  Surgeon: Hazle Lites, MD;  Location: [facility] INVASIVE CV LAB;  Service: Cardiovascular;  Laterality: N/A;    Current Outpatient Medications  Medication Sig Dispense Refill   albuterol  (VENTOLIN  HFA) 108 (90 Base) MCG/ACT inhaler INHALE 1 TO 2 PUFFS INTO THE LUNGS EVERY 6 HOURS AS NEEDED FOR WHEEZING OR SHORTNESS OF BREATH. SCHEDULE FOLLOW UP AND DISCUSSION OF YOUR THERAPY 18 g 2   amiodarone  (PACERONE ) 200 MG tablet TAKE 1 TABLET(200  MG) BY MOUTH DAILY 90 tablet 2   cholecalciferol (VITAMIN D3) 25 MCG (1000 UNIT) tablet Take 1,000 Units by mouth daily.     furosemide  (LASIX ) 40 MG tablet Take 1 tablet (40 mg total) by mouth daily. (Patient taking differently: Take 40 mg by mouth daily as needed for fluid or edema.) 30 tablet 3   ibuprofen (ADVIL) 200  MG tablet Take 200 mg by mouth as needed for moderate pain (pain score 4-6).     ipratropium-albuterol  (DUONEB) 0.5-2.5 (3) MG/3ML SOLN Take 3 mLs by nebulization every 4 (four) hours as needed. 90 mL 0   apixaban  (ELIQUIS ) 5 MG TABS tablet Take 1 tablet (5 mg total) by mouth 2 (two) times daily. 60 tablet 6   No current facility-administered medications for this encounter.    Allergies  Allergen Reactions   Pork-Derived Products Anaphylaxis    ROS- All systems are reviewed and negative except as per the HPI above.  Physical Exam: Vitals:   11/09/23 1115  BP: 136/86  Pulse: 63  Weight: 89.4 kg  Height: 5\' 3"  (1.6 m)    GEN- The patient is well appearing, alert and oriented x 3 today.   Neck - no JVD or carotid bruit noted Lungs- Clear to ausculation bilaterally, normal work of breathing Heart- Regular rate and rhythm, no murmurs, rubs or gallops, PMI not laterally displaced Extremities- no clubbing, cyanosis, or edema Skin - no rash or ecchymosis noted   Wt Readings from Last 3 Encounters:  11/09/23 89.4 kg  10/12/23 83 kg  06/05/23 87.5 kg    EKG today demonstrates: Vent. rate 63 BPM PR interval 214 ms QRS duration 100 ms QT/QTcB 464/474 ms P-R-T axes 70 -38 65 Sinus rhythm with sinus arrhythmia with 1st degree A-V block Left axis deviation Incomplete right bundle branch block Possible Anterior infarct , age undetermined Abnormal ECG   Echo 11/01/22:  1. Left ventricular ejection fraction, by estimation, is 50% with beat to  beat variability. The left ventricle has low normal function. The left  ventricle has no regional wall motion abnormalities. There is mild left  ventricular hypertrophy. Left  ventricular diastolic parameters are indeterminate.   2. Right ventricular systolic function is mildly reduced. The right  ventricular size is normal. There is normal pulmonary artery systolic  pressure. The estimated right ventricular systolic pressure is 29.6  mmHg.   3. Left atrial size was moderately dilated.   4. Right atrial size was moderately dilated.   5. The mitral valve is grossly normal. Trivial mitral valve  regurgitation. No evidence of mitral stenosis.   6. The aortic valve is tricuspid. Aortic valve regurgitation is not  visualized. No aortic stenosis is present.   7. The inferior vena cava is normal in size with greater than 50%  respiratory variability, suggesting right atrial pressure of 3 mmHg.   Epic records are reviewed at length today.  CHA2DS2-VASc Score = 2  The patient's score is based upon: CHF History: 0 HTN History: 0 Diabetes History: 0 Stroke History: 0 Vascular Disease History: 0 Age Score: 1 Gender Score: 1       ASSESSMENT AND PLAN: Persistent Atrial Fibrillation (ICD10:  I48.19) / atrial flutter The patient's CHA2DS2-VASc score is 2, indicating a 2.2% annual risk of stroke.   She is s/p successful DCCV on 10/27/22. (QT/Qtc 444 ms while in SR). S/p DCCV on 11/29/22 with successful conversion to NSR x 3 shocks. S/p unsuccessful DCCV on 05/01/23 (4 shocks with 2 being  360 J) S/p Afib ablation on 10/12/23 by Dr. Lawana Pray.  She is currently in NSR. Continue Eliquis  5 mg BID without interruption. Blood pressure is normal in NSR.   High risk medication monitoring (ICD10: [id number]) Patient requires ongoing monitoring for anti-arrhythmic medication which has the potential to cause life threatening arrhythmias or AV block. Qtc stable. Continue amiodarone  200 mg daily. Will likely be discontinued at upcoming visit.     Follow up with EP as scheduled.    Minnie Amber, PA-C Afib Clinic [facility] [address] [place], [place] [address] [phone] 11/09/2023 12:00 PM

## 2023-11-22 ENCOUNTER — Ambulatory Visit
Admission: RE | Admit: 2023-11-22 | Discharge: 2023-11-22 | Disposition: A | Discharge status: Home / Self Care | Payer: PPO | Source: Ambulatory Visit | Attending: Family Medicine | Admitting: Family Medicine

## 2023-11-22 DIAGNOSIS — N958 Other specified menopausal and perimenopausal disorders: Secondary | ICD-10-CM | POA: Diagnosis not present

## 2023-11-22 DIAGNOSIS — M8588 Other specified disorders of bone density and structure, other site: Secondary | ICD-10-CM | POA: Diagnosis not present

## 2023-11-22 DIAGNOSIS — Z13820 Encounter for screening for osteoporosis: Secondary | ICD-10-CM

## 2023-11-22 DIAGNOSIS — Z0000 Encounter for general adult medical examination without abnormal findings: Secondary | ICD-10-CM

## 2023-11-24 ENCOUNTER — Ambulatory Visit: Payer: Self-pay | Admitting: Family Medicine

## 2023-11-24 NOTE — Telephone Encounter (Signed)
 Osteopenia Reviewed DG bone density 11/22/2023: Osteopenia diagnosis. Note low BMD and FRAX calculator: -Major osteoporotic fracture: 13.6% -Hip fracture: 3.4% Also hx vitamin D deficiency on cholecalciferol 1000 IU daily. Recent creatinine 0.95 ~1.5 months ago.  Given hip fracture risk >3%, patient would benefit from bisphosphonate. Would consider starting alendronate 70 mg PO weekly, plan for maximum 5 year course until May 2030. Also switch to combination cholecalciferol-calcium.  Called patient, discussed results. Reviewed risks and benefits of bisphosphonate and plan to start alendronate. Patient declined starting bisphosphonate due to current aversion to medicines.  Acknowledged hip fracture risk and open to revisiting in future.  Will pick up OTC calcium tablets and continue vitamin D3 daily.   Dyspnea Also discussed patient's albuterol  use. She reports weekly inhaler use (usually 3 times one day per week). Use has increased with pollen and season change. Prior to recent PFA for Afib with Cardiology, patient had been using inhaler daily for dyspnea. She is able to ambulate farther and generally feels better after PFA. Patient declined OTC fluticasone and antihistamine. Agrees to follow up in clinic if still using albuterol  weekly as described in 2-4 weeks.

## 2023-11-24 NOTE — Telephone Encounter (Signed)
-----   Message from Penni Bowman sent at 11/22/2023  7:11 PM EDT ----- PCP to follow report ----- Message ----- From: Interface, Rad Results In Sent: 11/22/2023   6:20 PM EDT To: Arn Lane, MD

## 2023-12-15 ENCOUNTER — Other Ambulatory Visit: Payer: Self-pay | Admitting: Family Medicine

## 2024-01-10 ENCOUNTER — Ambulatory Visit: Attending: Pulmonary Disease | Admitting: Pulmonary Disease

## 2024-01-10 ENCOUNTER — Encounter: Payer: Self-pay | Admitting: Pulmonary Disease

## 2024-01-10 VITALS — BP 150/90 | HR 74 | Ht 63.0 in | Wt 192.0 lb

## 2024-01-10 DIAGNOSIS — I4819 Other persistent atrial fibrillation: Secondary | ICD-10-CM | POA: Diagnosis not present

## 2024-01-10 NOTE — Patient Instructions (Signed)
 Medication Instructions:  Stop amiodarone  *If you need a refill on your cardiac medications before your next appointment, please call your pharmacy*  Lab Work: None ordered If you have labs (blood work) drawn today and your tests are completely normal, you will receive your results only by: MyChart Message (if you have MyChart) OR A paper copy in the mail If you have any lab test that is abnormal or we need to change your treatment, we will call you to review the results.  Follow-Up: At [facility], you and your health needs are our priority.  As part of our continuing mission to provide you with exceptional heart care, our providers are all part of one team.  This team includes your primary Cardiologist (physician) and Advanced Practice Providers or APPs (Physician Assistants and Nurse Practitioners) who all work together to provide you with the care you need, when you need it.  Your next appointment:   6 month(s)  Provider:   Soyla Norton, MD only   Keep a blood pressure log for one month prior to your office visit with Dr Loni and bring it with you to that visit.

## 2024-01-10 NOTE — Progress Notes (Signed)
 Electrophysiology Office Note:   Date:  01/10/2024  ID:  Katye Valek, DOB [birth year], MRN [id number]  Primary Cardiologist: None Primary Heart Failure: None Electrophysiologist: None      History of Present Illness:   Jill Marquez is a 73 y.o. female with h/o AF, elevated blood pressure, seen today for routine electrophysiology follow-up s/p Ablation.  Since last being seen in our clinic the patient reports doing very well post ablation.  She had no problems with her groin sites.  No evidence of atrial fibrillation since ablation.  She monitors with a Kardia mobile and has maintained NSR.  She asks about the timing of stopping her Eliquis  and if she will be a candidate.  She denies chest pain, palpitations, dyspnea, PND, orthopnea, nausea, vomiting, dizziness, syncope, edema, weight gain, or early satiety.    Review of systems complete and found to be negative unless listed in HPI.   EP Information / Studies Reviewed:    EKG is ordered today. Personal review as below.  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 The patient's  score is based upon: CHF History: 0 HTN History: 0 Diabetes History: 0 Stroke History: 0 Vascular Disease History: 0 Age Score: 1 Gender Score: 1     HYPERTENSION CONTROL Vitals:   01/10/24 1504 01/10/24 1520  BP: (!) 185/102 (!) 150/90    The patient's blood pressure is elevated above target today.  In order to address the patient's elevated BP: Blood pressure will be monitored at home to determine if medication changes need to be made.; Follow up with general cardiology has been recommended.           Physical Exam:   VS:  BP (!) 150/90   Pulse 74   Ht 5' 3 (1.6 m)   Wt 192 lb (87.1 kg)   SpO2 92%   BMI 34.01 kg/m    Wt Readings from Last 3 Encounters:  01/10/24 192 lb (87.1 kg)  11/09/23 197 lb 3.2 oz (89.4 kg)  10/12/23 183 lb (83 kg)     GEN: Pleasant, well nourished, well developed in no acute distress NECK: No JVD; No carotid bruits CARDIAC: Regular rate and rhythm, no murmurs, rubs, gallops RESPIRATORY:  Clear to auscultation without rales, wheezing or rhonchi  ABDOMEN: Soft, non-tender, non-distended EXTREMITIES:  No edema; No deformity   ASSESSMENT AND PLAN:    Atrial Fibrillation  CHA2DS2-VASc 2 (1 pt for age, 1 pt for gender), s/p PVI + PW ablation 10/12/23  -continue OAC for stroke prophylaxis -discussed with patient that given her risk score, would like to see her maintain NSR for a prolonged period of time  before considering monitoring with a loop & coming off OAC.  She asks about Watchman procedure.  -monitors with [employer] -no AF burden post ablation   Secondary Hypercoagulable State  -continue Eliquis  5mg  BID, dose reviewed and appropriate by age / wt   Hypertension  -elevated blood pressures in clinic, discussed that she technically does have hypertension as we can see that she has had elevated pressures in the past.  Offered to start her on an antihypertensive but the patient feels that she can alter this with dietary interventions.   Discussed long-term side effects of elevated blood pressure.  Requested that she keep a record of her blood pressures 1-2x weekly for the month preceding her upcoming visit with Dr. Loni.  Elevated CAC Score  CAC score 491 which is 91st percentile for matched controls  -encouraged Cardiology follow up   Medication list reviewed with patient and multiple medications removed at patient's request.  Follow up with Dr. Inocencio January 2026 to discuss options of loop with monitoring for A-fib and possibly coming off OAC vs Watchman   Signed, Daphne Barrack, NP-C, AGACNP-BC [facility] - Electrophysiology  01/10/2024, 5:36 PM

## 2024-01-29 ENCOUNTER — Ambulatory Visit: Payer: PPO

## 2024-01-29 VITALS — Ht 63.0 in | Wt 178.0 lb

## 2024-01-29 DIAGNOSIS — Z0000 Encounter for general adult medical examination without abnormal findings: Secondary | ICD-10-CM | POA: Diagnosis not present

## 2024-01-29 NOTE — Progress Notes (Signed)
 Because this visit was a virtual/telehealth visit,  certain criteria was not obtained, such a blood pressure, CBG if applicable, and timed get up and go. Any medications not marked as taking were not mentioned during the medication reconciliation part of the visit. Any vitals not documented were not able to be obtained due to this being a telehealth visit or patient was unable to self-report a recent blood pressure reading due to a lack of equipment at home via telehealth. Vitals that have been documented are verbally provided by the patient.   Subjective:   Jill Marquez is a 73 y.o. who presents for a Medicare Wellness preventive visit.  As a reminder, Annual Wellness Visits don't include a physical exam, and some assessments may be limited, especially if this visit is performed virtually. We may recommend an in-person follow-up visit with your provider if needed.  Visit Complete: Virtual I connected with  Jill Marquez on 01/29/24 by a audio enabled telemedicine application and verified that I am speaking with the correct person using two identifiers.  Patient Location: Home  Provider Location: Home Office  I discussed the limitations of evaluation and management by telemedicine. The patient expressed understanding and agreed to proceed.  Vital Signs: Because this visit was a virtual/telehealth visit, some criteria may be missing or patient reported. Any vitals not documented were not able to be obtained and vitals that have been documented are patient reported.  VideoDeclined- This patient declined Librarian, academic. Therefore the visit was completed with audio only.  Persons Participating in Visit: Patient.  AWV Questionnaire: No: Patient Medicare AWV questionnaire was not completed prior to this visit.  Cardiac Risk Factors include: advanced age (>73men, >54 women);family history of premature cardiovascular disease;obesity (BMI >30kg/m2)      Objective:    Today's Vitals   01/29/24 1344  Weight: 178 lb (80.7 kg)  Height: 5' 3 (1.6 m)  PainSc: 0-No pain   Body mass index is 31.53 kg/m.     01/29/2024    1:45 PM 10/12/2023   10:58 AM 01/23/2023    3:55 PM 10/27/2022   11:44 AM 10/14/2022    1:58 PM 10/07/2022    1:30 PM 09/26/2022    4:14 PM  Advanced Directives  Does Patient Have a Medical Advance Directive? No No No No No No No  Would patient like information on creating a medical advance directive? No - Patient declined No - Patient declined Yes (MAU/Ambulatory/Procedural Areas - Information given)  No - Patient declined  No - Patient declined    Current Medications (verified) Outpatient Encounter Medications as of 01/29/2024  Medication Sig   albuterol  (VENTOLIN  HFA) 108 (90 Base) MCG/ACT inhaler INHALE 1 TO 2 PUFFS INTO THE LUNGS EVERY 6 HOURS AS NEEDED FOR WHEEZING OR SHORTNESS OF BREATH. SCHEDULE FOLLOW UP AND DISCUSSION OF YOUR THERAPY   apixaban  (ELIQUIS ) 5 MG TABS tablet Take 1 tablet (5 mg total) by mouth 2 (two) times daily.   Ascorbic Acid (VITAMIN C) 500 MG CAPS Take 500 mg by mouth daily.   cholecalciferol (VITAMIN D3) 25 MCG (1000 UNIT) tablet Take 1,000 Units by mouth daily.   ipratropium-albuterol  (DUONEB) 0.5-2.5 (3) MG/3ML SOLN Take 3 mLs by nebulization every 4 (four) hours as needed.   No facility-administered encounter medications on file as of 01/29/2024.    Allergies (verified) Pork-derived products   History: Past Medical History:  Diagnosis Date   Allergy    Atrial fibrillation (HCC)    Elevated blood  pressure reading    Vitamin D deficiency    Past Surgical History:  Procedure Laterality Date   ATRIAL FIBRILLATION ABLATION N/A 10/12/2023   Procedure: ATRIAL FIBRILLATION ABLATION;  Surgeon: Inocencio Soyla Lunger, MD;  Location: [facility];  Service: Cardiovascular;  Laterality: N/A;   CARDIOVERSION N/A 10/27/2022   Procedure: CARDIOVERSION;  Surgeon: Alvan Ronal BRAVO, MD;   Location: [facility];  Service: Cardiovascular;  Laterality: N/A;   CARDIOVERSION N/A 11/29/2022   Procedure: CARDIOVERSION;  Surgeon: Loni Soyla LABOR, MD;  Location: [facility];  Service: Cardiovascular;  Laterality: N/A;   CARDIOVERSION N/A 05/01/2023   Procedure: CARDIOVERSION;  Surgeon: Mona Vinie BROCKS, MD;  Location: [facility];  Service: Cardiovascular;  Laterality: N/A;   Family History  Problem Relation Age of Onset   Hypertension Mother    Thyroid  disease Mother    Heart disease Mother    Social History   Socioeconomic History   Marital status: Single    Spouse name: Not on file   Number of children: Not on file   Years of education: Not on file   Highest education level: Some college, no degree  Occupational History   Not on file  Tobacco Use   Smoking status: Former    Current packs/day: 0.00    Types: Cigarettes    Quit date: 04/03/2022    Years since quitting: 1.8   Smokeless tobacco: Never   Tobacco comments:    Former smoker 11/09/23  Vaping Use   Vaping status: Never Used  Substance and Sexual Activity   Alcohol use: Yes    Alcohol/week: 1.0 - 2.0 standard drink of alcohol    Types: 1 - 2 Standard drinks or equivalent per week    Comment: ocass   Drug use: Not Currently   Sexual activity: Not Currently    Birth control/protection: None  Other Topics Concern   Not on file  Social History Narrative   Not on file   Social Drivers of Health   Financial Resource Strain: Low Risk  (01/29/2024)   Overall Financial Resource Strain (CARDIA)    Difficulty of Paying Living Expenses: Not hard at all  Food Insecurity: No Food Insecurity (01/29/2024)   Hunger Vital Sign    Worried About Running Out of Food in the Last Year: Never true    Ran Out of Food in the Last Year: Never true  Transportation Needs: No Transportation Needs (01/29/2024)   PRAPARE - Administrator, Civil Service (Medical): No    Lack of Transportation  (Non-Medical): No  Physical Activity: Sufficiently Active (01/22/2024)   Exercise Vital Sign    Days of Exercise per Week: 7 days    Minutes of Exercise per Session: 30 min  Stress: No Stress Concern Present (01/29/2024)   [name] of Occupational Health - Occupational Stress Questionnaire    Feeling of Stress: Not at all  Social Connections: Moderately Integrated (01/29/2024)   Social Connection and Isolation Panel    Frequency of Communication with Friends and Family: More than three times a week    Frequency of Social Gatherings with Friends and Family: Three times a week    Attends Religious Services: More than 4 times per year    Active Member of Clubs or Organizations: Yes    Attends Banker Meetings: 1 to 4 times per year    Marital Status: Widowed    Tobacco Counseling Counseling given: Not Answered Tobacco  comments: Former smoker 11/09/23    Clinical Intake:  Pre-visit preparation completed: Yes  Pain : No/denies pain Pain Score: 0-No pain     BMI - recorded: 31.53 Nutritional Status: BMI > 30  Obese Nutritional Risks: None Diabetes: No  Lab Results  Component Value Date   HGBA1C 5.7 (H) 12/23/2022     How often do you need to have someone help you when you read instructions, pamphlets, or other written materials from your doctor or pharmacy?: 1 - Never  Interpreter Needed?: No  Information entered by :: Laveyah Oriol N. Preslyn Warr, LPN.   Activities of Daily Living     01/29/2024    1:56 PM 01/22/2024   10:47 AM  In your present state of health, do you have any difficulty performing the following activities:  Hearing? 0 0  Vision? 0 0  Difficulty concentrating or making decisions? 0 0  Walking or climbing stairs? 0 0  Dressing or bathing? 0 0  Doing errands, shopping? 0 0  Preparing Food and eating ? N N  Using the Toilet? N N  In the past six months, have you accidently leaked urine? N N  Do you have problems with loss of bowel  control? N N  Managing your Medications? N N  Managing your Finances? N N  Housekeeping or managing your Housekeeping? N N    Patient Care Team: Toma Matas, MD as PCP - General (Family Medicine) Inocencio Soyla Lunger, MD as Consulting Physician (Cardiology) [place], Heather, OD as Consulting Physician (Optometry)  I have updated your Care Teams any recent Medical Services you may have received from other providers in the past year.     Assessment:   This is a routine wellness examination for [place].  Hearing/Vision screen Hearing Screening - Comments:: Denies hearing difficulties   Vision Screening - Comments:: Wears contacts  - up to date with routine eye exams with Heather Burundi, OD.    Goals Addressed             This Visit's Progress    01/29/24: My weight goal is to be between 150-170.         Depression Screen     01/29/2024    1:51 PM 01/23/2023    3:49 PM 12/23/2022    2:37 PM 10/14/2022    1:57 PM 10/07/2022    1:31 PM  PHQ 2/9 Scores  PHQ - 2 Score 0 0 0 0 0  PHQ- 9 Score 0   0 3    Fall Risk     01/29/2024    1:56 PM 01/22/2024   10:47 AM 01/23/2023    3:52 PM 01/20/2023    9:38 AM 12/23/2022    2:37 PM  Fall Risk   Falls in the past year? 0 0 1 1 0  Number falls in past yr: 0  0 0   Injury with Fall? 0  0 0 0  Risk for fall due to : No Fall Risks  History of fall(s)  No Fall Risks  Follow up Falls evaluation completed  Falls evaluation completed;Education provided;Falls prevention discussed      MEDICARE RISK AT HOME:  Medicare Risk at Home Any stairs in or around the home?: Yes If so, are there any without handrails?: No Home free of loose throw rugs in walkways, pet beds, electrical cords, etc?: Yes Adequate lighting in your home to reduce risk of falls?: Yes Life alert?: No Use of a cane, walker or w/c?: No Grab  bars in the bathroom?: No Shower chair or bench in shower?: No Elevated toilet seat or a handicapped toilet?: No  TIMED UP  AND GO:  Was the test performed?  No  Cognitive Function: 6CIT completed    01/29/2024    1:51 PM  MMSE - Mini Mental State Exam  Not completed: Unable to complete        01/29/2024    1:56 PM 01/23/2023    3:55 PM  6CIT Screen  What Year? 0 points 0 points  What month? 0 points 0 points  What time? 0 points 0 points  Count back from 20 0 points 0 points  Months in reverse 0 points 0 points  Repeat phrase 0 points 0 points  Total Score 0 points 0 points    Immunizations Immunization History  Administered Date(s) Administered   Influenza-Unspecified 04/21/2023   PFIZER(Purple Top)SARS-COV-2 Vaccination 09/13/2019, 10/07/2019, 04/18/2020, 04/21/2023   PNEUMOCOCCAL CONJUGATE-20 04/24/2023   Pneumococcal-Unspecified 04/24/2023    Screening Tests Health Maintenance  Topic Date Due   DTaP/Tdap/Td (1 - Tdap) Never done   Zoster Vaccines- Shingrix (1 of 2) Never done   Colonoscopy  Never done   COVID-19 Vaccine (5 - 2024-25 season) 06/16/2023   INFLUENZA VACCINE  02/02/2024   Medicare Annual Wellness (AWV)  01/28/2025   MAMMOGRAM  05/16/2025   Pneumococcal Vaccine: 50+ Years  Completed   DEXA SCAN  Completed   Hepatitis C Screening  Completed   Hepatitis B Vaccines  Aged Out   HPV VACCINES  Aged Out   Meningococcal B Vaccine  Aged Out    Health Maintenance  Health Maintenance Due  Topic Date Due   DTaP/Tdap/Td (1 - Tdap) Never done   Zoster Vaccines- Shingrix (1 of 2) Never done   Colonoscopy  Never done   COVID-19 Vaccine (5 - 2024-25 season) 06/16/2023   Health Maintenance Items Addressed: Yes Patient aware of current care gaps.  Immunization record was verified by [facility] and updated in patient's chart.  Additional Screening:  Vision Screening: Recommended annual ophthalmology exams for early detection of glaucoma and other disorders of the eye. Would you like a referral to an eye doctor? No    Dental Screening: Recommended annual dental exams for proper  oral hygiene  Community Resource Referral / Chronic Care Management: CRR required this visit?  No   CCM required this visit?  No   Plan:    I have personally reviewed and noted the following in the patient's chart:   Medical and social history Use of alcohol, tobacco or illicit drugs  Current medications and supplements including opioid prescriptions. Patient is not currently taking opioid prescriptions. Functional ability and status Nutritional status Physical activity Advanced directives List of other physicians Hospitalizations, surgeries, and ER visits in previous 12 months Vitals Screenings to include cognitive, depression, and falls Referrals and appointments  In addition, I have reviewed and discussed with patient certain preventive protocols, quality metrics, and best practice recommendations. A written personalized care plan for preventive services as well as general preventive health recommendations were provided to patient.   Roz LOISE Fuller, LPN   2/71/7974   After Visit Summary: (MyChart) Due to this being a telephonic visit, the after visit summary with patients personalized plan was offered to patient via MyChart   Notes: Patient aware of current care gaps.  Immunization record was verified by [facility] and updated in patient's chart.

## 2024-01-29 NOTE — Patient Instructions (Signed)
 Jill Marquez , Thank you for taking time out of your busy schedule to complete your Annual Wellness Visit with me. I enjoyed our conversation and look forward to speaking with you again next year. I, as well as your care team,  appreciate your ongoing commitment to your health goals. Please review the following plan we discussed and let me know if I can assist you in the future. Your Game plan/ To Do List    Referrals: If you haven't heard from the office you've been referred to, please reach out to them at the phone provided.   Follow up Visits: Next Medicare AWV with our clinical staff: 01/30/2025 at 2:10 pm phone visit with Nurse Health Advisor   Have you seen your provider in the last 6 months (3 months if uncontrolled diabetes)? Yes Next Office Visit with your provider: Patient will call schedule.  Clinician Recommendations:  Aim for 30 minutes of exercise or brisk walking, 6-8 glasses of water, and 5 servings of fruits and vegetables each day.       This is a list of the screening recommended for you and due dates:  Health Maintenance  Topic Date Due   DTaP/Tdap/Td vaccine (1 - Tdap) Never done   Zoster (Shingles) Vaccine (1 of 2) Never done   Colon Cancer Screening  Never done   COVID-19 Vaccine (5 - 2024-25 season) 06/16/2023   Flu Shot  02/02/2024   Medicare Annual Wellness Visit  01/28/2025   Mammogram  05/16/2025   Pneumococcal Vaccine for age over 74  Completed   DEXA scan (bone density measurement)  Completed   Hepatitis C Screening  Completed   Hepatitis B Vaccine  Aged Out   HPV Vaccine  Aged Out   Meningitis B Vaccine  Aged Out    Advanced directives: (Copy Requested) Please bring a copy of your health care power of attorney and living will to the office to be added to your chart at your convenience. You can mail to [facility] [address] [place], [place] [address] or email to [email or web address] Advance Care Planning is important because  it:  [x]  Makes sure you receive the medical care that is consistent with your values, goals, and preferences  [x]  It provides guidance to your family and loved ones and reduces their decisional burden about whether or not they are making the right decisions based on your wishes.  Follow the link provided in your after visit summary or read over the paperwork we have mailed to you to help you started getting your Advance Directives in place. If you need assistance in completing these, please reach out to us  so that we can help you!  See attachments for Preventive Care and Fall Prevention Tips.

## 2024-02-02 ENCOUNTER — Telehealth: Payer: Self-pay | Admitting: Family Medicine

## 2024-02-02 NOTE — Telephone Encounter (Signed)
 Called patient to schedule. LVM to call back.   Appointment Notes: address care gaps.   Thanks!

## 2024-02-27 ENCOUNTER — Other Ambulatory Visit: Payer: Self-pay | Admitting: Family Medicine

## 2024-03-20 ENCOUNTER — Ambulatory Visit: Attending: Internal Medicine | Admitting: Internal Medicine

## 2024-03-20 ENCOUNTER — Encounter: Payer: Self-pay | Admitting: Internal Medicine

## 2024-03-20 VITALS — BP 168/112 | HR 84 | Ht 63.0 in | Wt 200.4 lb

## 2024-03-20 DIAGNOSIS — I4819 Other persistent atrial fibrillation: Secondary | ICD-10-CM | POA: Diagnosis not present

## 2024-03-20 DIAGNOSIS — I4510 Unspecified right bundle-branch block: Secondary | ICD-10-CM | POA: Diagnosis not present

## 2024-03-20 DIAGNOSIS — R931 Abnormal findings on diagnostic imaging of heart and coronary circulation: Secondary | ICD-10-CM

## 2024-03-20 DIAGNOSIS — D6869 Other thrombophilia: Secondary | ICD-10-CM

## 2024-03-20 NOTE — Progress Notes (Signed)
 Cardiology Office Note:  .   Date:  03/20/2024  ID:  Jill Marquez, DOB 01-Apr-1951, MRN [id number] PCP: Toma Matas, MD  [facility] Providers Cardiologist:  None    History of Present Illness: .   Jill Marquez is a 73 y.o. female.  Discussed the use of AI scribe software for clinical note transcription with the patient, who gave verbal consent to proceed.  History of Present Illness Jill Marquez is a 73 year old female with hypertension and coronary artery disease who presents for cardiovascular follow-up.  She experiences elevated blood pressure readings in clinical settings, attributed to 'white coat hypertension,' with home readings between 130-140/90 mmHg. She is not on antihypertensive medication and is attempting lifestyle modifications.  She has a history of atrial fibrillation, underwent cardioversion last October, and was on amiodarone  until July 9th. Feeling well since. Uses albuterol  as needed.  Her calcium score is 491, indicating plaque and calcium in her coronary arteries, including the left main coronary artery. She quit smoking two years ago and consumes minimal alcohol. She is currently taking Eliquis  and is satisfied with her medication regimen.  She is concerned about her weight despite increased physical activity and dietary changes. She has been walking more and feels her clothes fit better, attributing weight gain to muscle gain.    ROS: negative except per HPI above.  Studies Reviewed: SABRA   EKG Interpretation Date/Time:  Wednesday March 20 2024 14:57:01 EDT Ventricular Rate:  79 PR Interval:  216 QRS Duration:  98 QT Interval:  424 QTC Calculation: 486 R Axis:   -67  Text Interpretation: Sinus rhythm with 1st degree A-V block Incomplete right bundle branch block Left anterior fascicular block Possible Anterior infarct (cited on or before 20-Mar-2024) When compared with ECG of 10-Jan-2024 15:08, Left anterior fascicular block  is now Present Confirmed by Loni Rushing ([id number]) on 03/20/2024 3:22:32 PM    Results RADIOLOGY Coronary CT: Calcium score 491, plaque and calcium in left main coronary artery, right coronary artery, and circumflex artery  DIAGNOSTIC EKG: Normal rhythm, incomplete right bundle branch block, mild slowing of left anterior fascicle (03/20/2024) Risk Assessment/Calculations:    CHA2DS2-VASc Score = 2   This indicates a 2.2% annual risk of stroke. The patient's score is based upon: CHF History: 0 HTN History: 0 Diabetes History: 0 Stroke History: 0 Vascular Disease History: 0 Age Score: 1 Gender Score: 1       Physical Exam:   VS:  BP (!) 168/112   Pulse 84   Ht 5' 3 (1.6 m)   Wt 200 lb 6.4 oz (90.9 kg)   SpO2 90%   BMI 35.50 kg/m    Wt Readings from Last 3 Encounters:  03/20/24 200 lb 6.4 oz (90.9 kg)  01/29/24 178 lb (80.7 kg)  01/10/24 192 lb (87.1 kg)     Physical Exam GENERAL: Alert, cooperative, well developed, no acute distress. HEENT: Normocephalic, normal oropharynx, moist mucous membranes. CHEST: Clear to auscultation bilaterally, no wheezes, rhonchi, or crackles. CARDIOVASCULAR: Normal heart rate and rhythm, S1 and S2 normal without murmurs. Heart sounds normal, heart moves normally. ABDOMEN: Soft, non-tender, non-distended, without organomegaly, normal bowel sounds. EXTREMITIES: No cyanosis or edema. NEUROLOGICAL: Cranial nerves grossly intact, moves all extremities without gross motor or sensory deficit.   ASSESSMENT AND PLAN: .    Assessment and Plan Assessment & Plan Atrial fibrillation, currently in normal sinus rhythm Normal sinus rhythm, no recent atrial fibrillation episodes, EKG shows incomplete right bundle branch block. -  continue eliquis  5 mg BID  Essential hypertension Blood pressure slightly elevated, possible white coat hypertension, recent weight gain may influence readings. She prefers weight loss before medication. - Monitor blood  pressure at home and keep a log. - Encourage weight loss of 5-10 pounds over the next 3-4 months. - Reassess blood pressure in 3-4 months and consider medication if no improvement.  Coronary atherosclerosis with elevated coronary artery calcium score Coronary artery calcium score of 491 indicates significant plaque, high cardiovascular risk. Statin therapy recommended to stabilize plaque and reduce heart attack risk. - Consider starting statin therapy; highly recommend. - Educate on the benefits of statins in stabilizing plaque and reducing cardiovascular risk. - Discuss alternative options such as injectable cholesterol medications [id number] if statins are not tolerated.  Incomplete right bundle branch block EKG shows incomplete right bundle branch block, no symptoms, no significant change from previous EKGs. - Reassess at follow ups.      Soyla Merck, MD, FACC

## 2024-03-20 NOTE — Patient Instructions (Signed)
 Medication Instructions:  Your physician recommends that you continue on your current medications as directed. Please refer to the Current Medication list given to you today.  *If you need a refill on your cardiac medications before your next appointment, please call your pharmacy*  Lab Work: None ordered.  You may go to any [facility] Location for your lab work:  [facility] - [address] ([facility]) - [address] [phone] [address]  [place] - [address]   [place]  - [address]   [place] - [address] - [address] [employer]  - [address] [place] - [address] ([facility]   If you have labs (blood work) drawn today and your tests are completely normal, you will receive your results only by: [employer] (if you have MyChart)  If you have any lab test that is abnormal or we need to change your treatment, we will call you or send a MyChart message to review the results.  Testing/Procedures: None ordered.  Follow-Up: At [facility], you and your health needs are our priority.  As part of our continuing mission to provide you with exceptional heart care, we have created designated Provider Care Teams.  These Care Teams include your primary Cardiologist (physician) and Advanced Practice Providers (APPs -  Physician Assistants and Nurse Practitioners) who all work together to provide you with the care you need, when you need it.  Your next appointment:   3/4 months   The format for your next appointment:   In Person  Provider:   Soyla Merck, MD or a Advanced Practice Providers

## 2024-05-13 ENCOUNTER — Other Ambulatory Visit: Payer: Self-pay | Admitting: Family Medicine

## 2024-06-21 ENCOUNTER — Encounter: Payer: Self-pay | Admitting: Internal Medicine

## 2024-07-08 ENCOUNTER — Other Ambulatory Visit (HOSPITAL_COMMUNITY): Payer: Self-pay | Admitting: Internal Medicine

## 2024-07-17 ENCOUNTER — Ambulatory Visit: Admitting: Cardiology

## 2024-08-01 ENCOUNTER — Other Ambulatory Visit: Payer: Self-pay | Admitting: Family Medicine

## 2024-08-14 ENCOUNTER — Ambulatory Visit: Admitting: Internal Medicine

## 2024-09-26 ENCOUNTER — Ambulatory Visit: Admitting: Cardiology

## 2025-01-30 ENCOUNTER — Encounter
